# Patient Record
Sex: Female | Born: 1980 | Race: Black or African American | Hispanic: No | Marital: Single | Smoking: Current every day smoker
Health system: Southern US, Community
[De-identification: ages and names within clinical notes are randomized; demographics above are authoritative.]

## PROBLEM LIST (undated history)

## (undated) ENCOUNTER — Emergency Department (HOSPITAL_COMMUNITY): Admission: EM | Disposition: A | Discharge status: Home / Self Care | Payer: Medicaid Other

## (undated) DIAGNOSIS — R569 Unspecified convulsions: Secondary | ICD-10-CM

---

## 2004-08-11 ENCOUNTER — Emergency Department: Payer: Self-pay | Admitting: Unknown Physician Specialty

## 2005-04-23 ENCOUNTER — Emergency Department: Payer: Self-pay | Admitting: Emergency Medicine

## 2005-08-14 ENCOUNTER — Emergency Department: Payer: Self-pay | Admitting: Unknown Physician Specialty

## 2005-08-15 ENCOUNTER — Ambulatory Visit: Payer: Self-pay | Admitting: Unknown Physician Specialty

## 2005-10-24 ENCOUNTER — Emergency Department: Payer: Self-pay | Admitting: Emergency Medicine

## 2006-06-06 ENCOUNTER — Ambulatory Visit: Payer: Self-pay | Admitting: Family Medicine

## 2006-06-22 ENCOUNTER — Emergency Department: Payer: Self-pay | Admitting: Emergency Medicine

## 2007-07-25 ENCOUNTER — Emergency Department: Payer: Self-pay | Admitting: Internal Medicine

## 2007-08-29 ENCOUNTER — Emergency Department: Payer: Self-pay | Admitting: Emergency Medicine

## 2007-09-06 ENCOUNTER — Emergency Department: Payer: Self-pay | Admitting: Emergency Medicine

## 2007-11-01 ENCOUNTER — Emergency Department: Payer: Self-pay | Admitting: Unknown Physician Specialty

## 2008-03-06 ENCOUNTER — Emergency Department: Payer: Self-pay | Admitting: Emergency Medicine

## 2008-03-28 ENCOUNTER — Emergency Department (HOSPITAL_COMMUNITY): Admission: EM | Admit: 2008-03-28 | Discharge: 2008-03-28 | Payer: Self-pay | Admitting: Emergency Medicine

## 2008-05-05 ENCOUNTER — Emergency Department: Payer: Self-pay | Admitting: Emergency Medicine

## 2008-11-24 ENCOUNTER — Ambulatory Visit: Payer: Self-pay | Admitting: Obstetrics and Gynecology

## 2008-11-25 ENCOUNTER — Inpatient Hospital Stay: Payer: Self-pay | Admitting: Obstetrics and Gynecology

## 2008-12-19 ENCOUNTER — Emergency Department: Payer: Self-pay | Admitting: Emergency Medicine

## 2009-01-19 ENCOUNTER — Ambulatory Visit: Payer: Self-pay | Admitting: Pain Medicine

## 2009-02-16 ENCOUNTER — Ambulatory Visit: Payer: Self-pay

## 2009-03-14 ENCOUNTER — Emergency Department: Payer: Self-pay | Admitting: Emergency Medicine

## 2009-03-28 ENCOUNTER — Emergency Department: Payer: Self-pay | Admitting: Emergency Medicine

## 2009-06-22 ENCOUNTER — Emergency Department: Payer: Self-pay | Admitting: Emergency Medicine

## 2009-08-22 ENCOUNTER — Ambulatory Visit: Payer: Self-pay | Admitting: Family Medicine

## 2010-01-04 ENCOUNTER — Ambulatory Visit: Payer: Self-pay | Admitting: Family Medicine

## 2010-02-05 ENCOUNTER — Emergency Department: Payer: Self-pay | Admitting: Emergency Medicine

## 2010-03-21 ENCOUNTER — Emergency Department: Payer: Self-pay | Admitting: Emergency Medicine

## 2010-06-16 ENCOUNTER — Emergency Department: Payer: Self-pay | Admitting: Emergency Medicine

## 2010-09-16 ENCOUNTER — Emergency Department: Payer: Self-pay | Admitting: Emergency Medicine

## 2010-12-09 ENCOUNTER — Emergency Department: Payer: Self-pay | Admitting: Emergency Medicine

## 2011-02-07 ENCOUNTER — Emergency Department: Payer: Self-pay | Admitting: Emergency Medicine

## 2011-07-11 ENCOUNTER — Emergency Department: Payer: Self-pay | Admitting: *Deleted

## 2011-07-25 LAB — URINALYSIS, ROUTINE W REFLEX MICROSCOPIC
Bilirubin Urine: NEGATIVE
Hgb urine dipstick: NEGATIVE
Ketones, ur: NEGATIVE
Nitrite: NEGATIVE
Specific Gravity, Urine: 1.022
Urobilinogen, UA: 0.2
pH: 5.5

## 2011-07-25 LAB — HCG, QUANTITATIVE, PREGNANCY: hCG, Beta Chain, Quant, S: 1215 — ABNORMAL HIGH

## 2012-05-27 IMAGING — CT CT HEAD WITHOUT CONTRAST
2 series · 16 of 30 positions shown, 20 images · non-contrast
Comparison: none

REASON FOR EXAM: SEIZURE, HA
COMMENTS:   May transport without cardiac monitor

PROCEDURE:     CT  - CT HEAD WITHOUT CONTRAST  - December 09, 2010  [DATE]
RESULT:     Comparison:  None
TECHNIQUE: Multiple axial images from the foramen magnum to the vertex were
obtained without IV contrast.

[Series 2: without · axial · non-contrast · 0.39mm/px · z∈[+349,+474]mm · 13 of 31 slices shown, 17 images]
[im 3/31  brain]
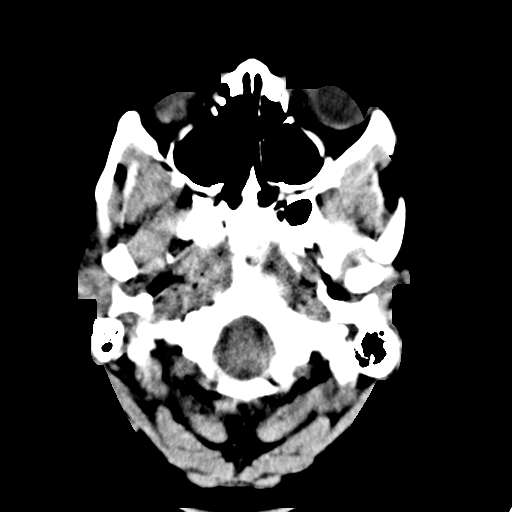
[im 3/31  bone]
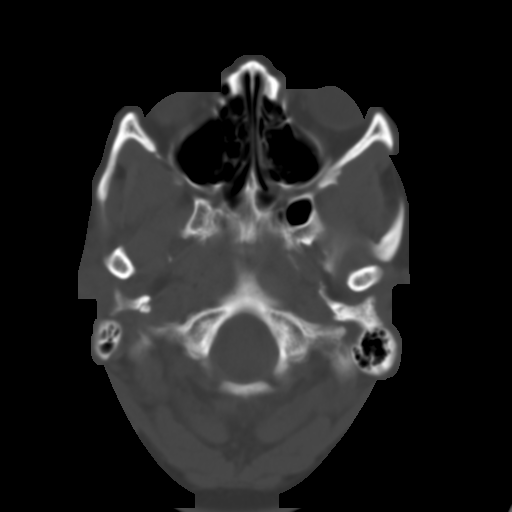
[im 5/31  brain]
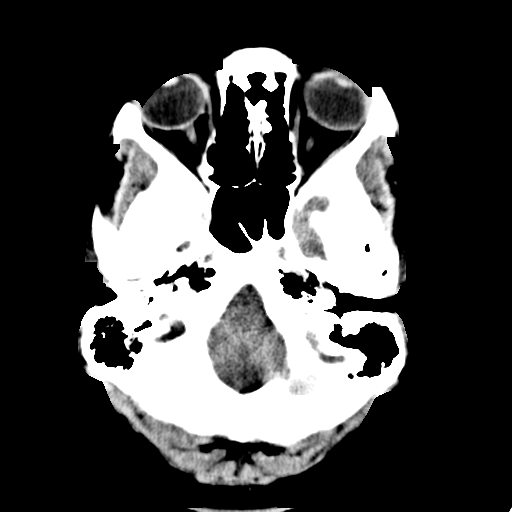
[im 7/31  brain]
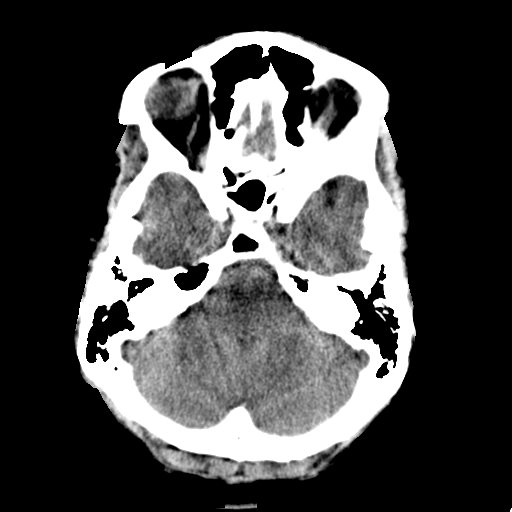
[im 9/31  brain]
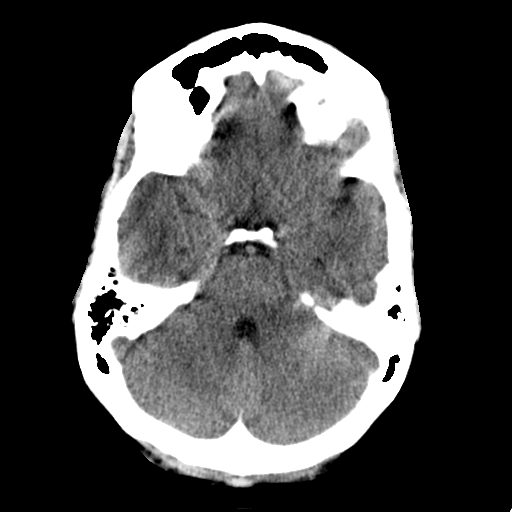
[im 11/31  brain]
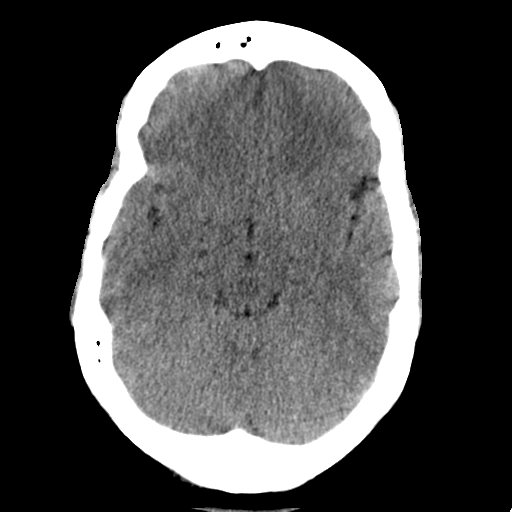
[im 11/31  bone]
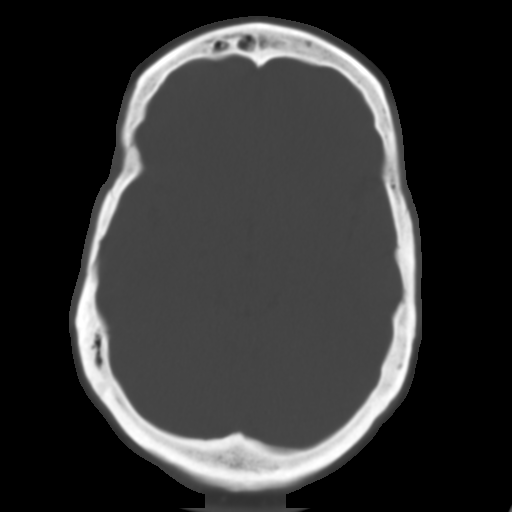
[im 13/31  brain]
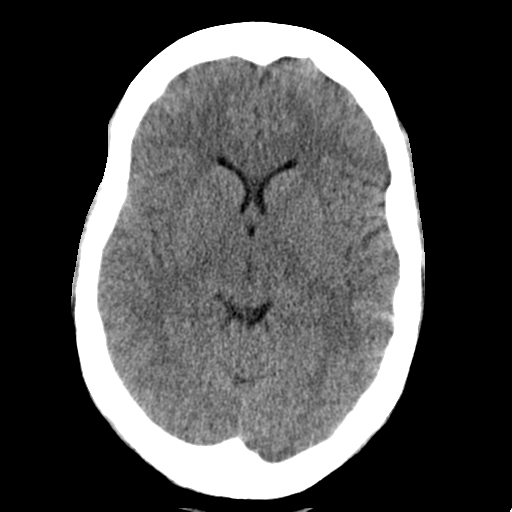
[im 16/31  brain]
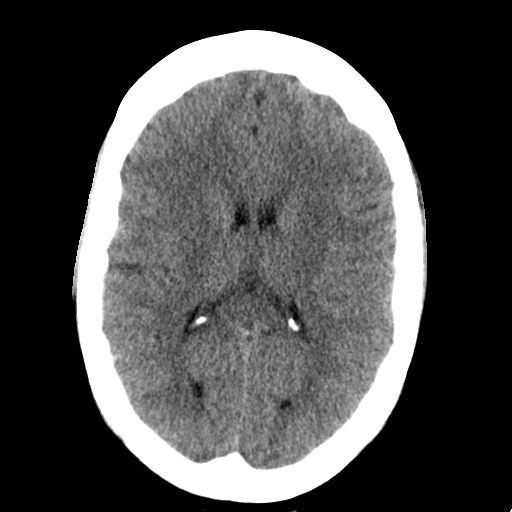
[im 18/31  brain]
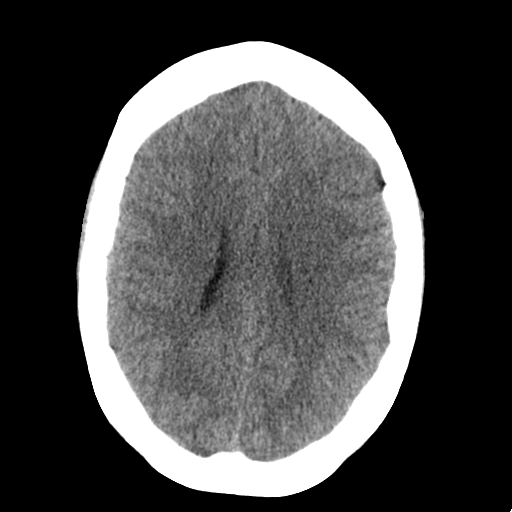
[im 20/31  brain]
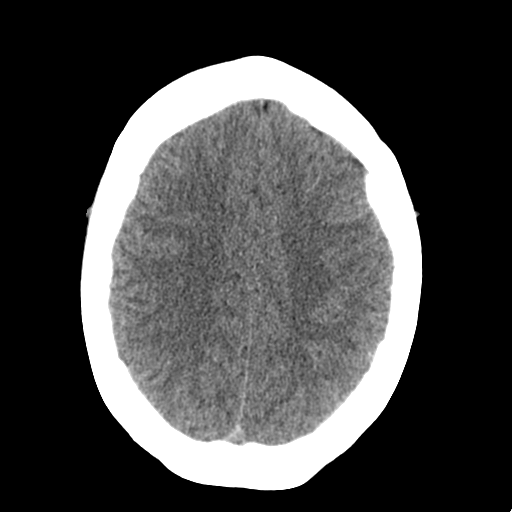
[im 20/31  bone]
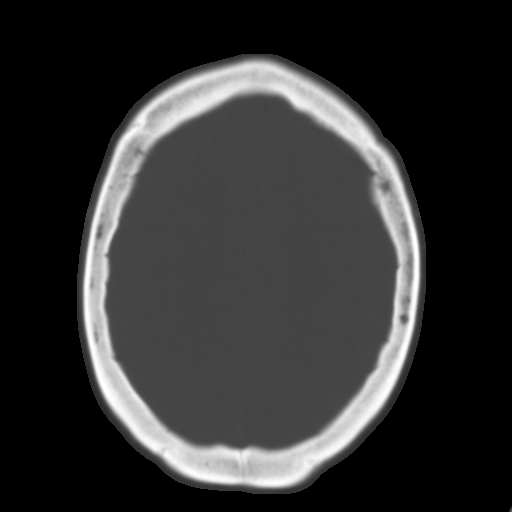
[im 22/31  brain]
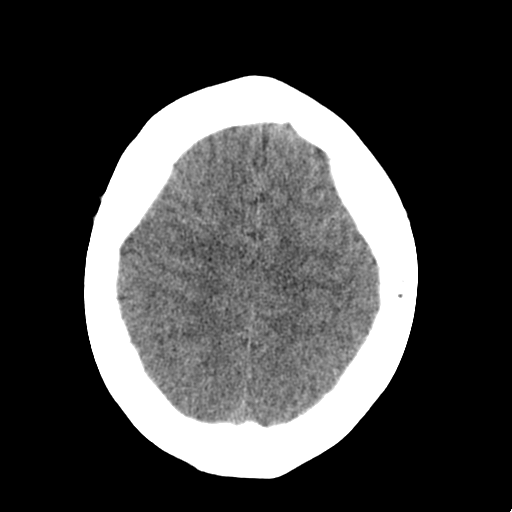
[im 24/31  brain]
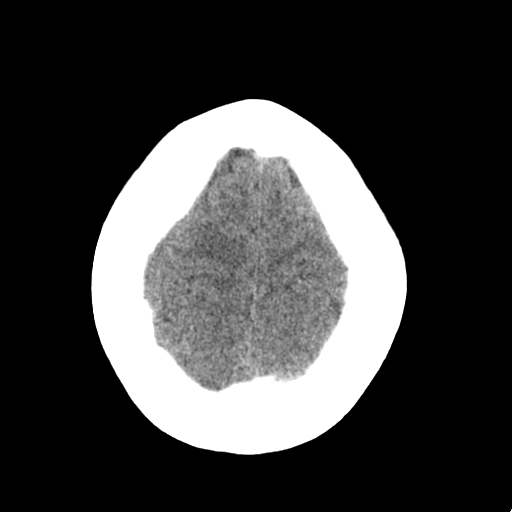
[im 26/31  brain]
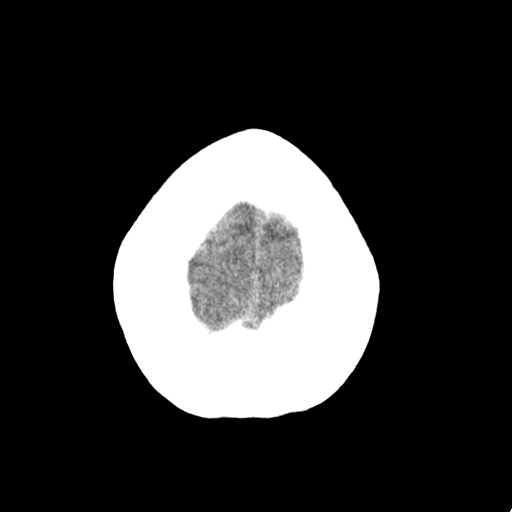
[im 28/31  brain]
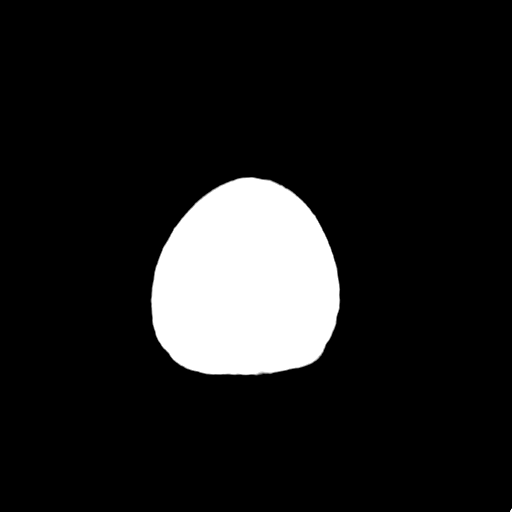
[im 28/31  bone]
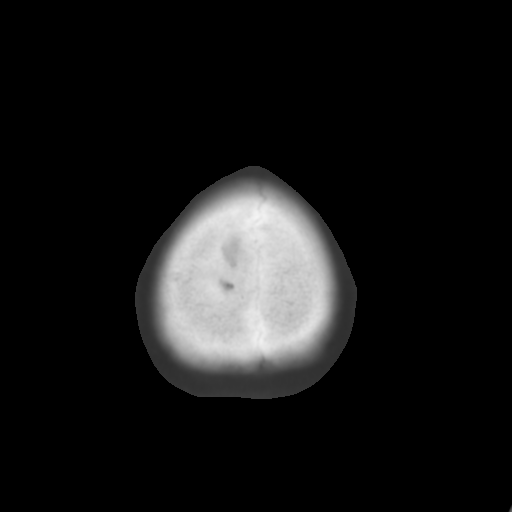

[Series 3: bone · axial · 0.39mm/px · z∈[+349,+389]mm · 3 of 31 slices shown]
[im 3/31  bone]
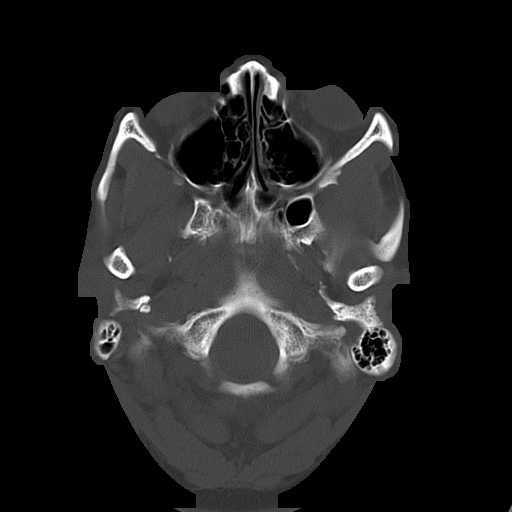
[im 7/31  bone]
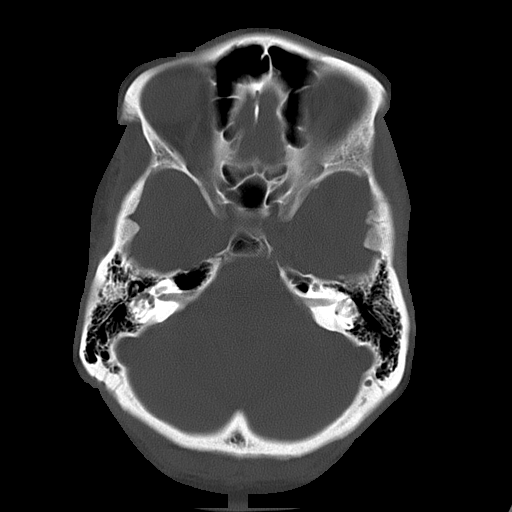
[im 11/31  bone]
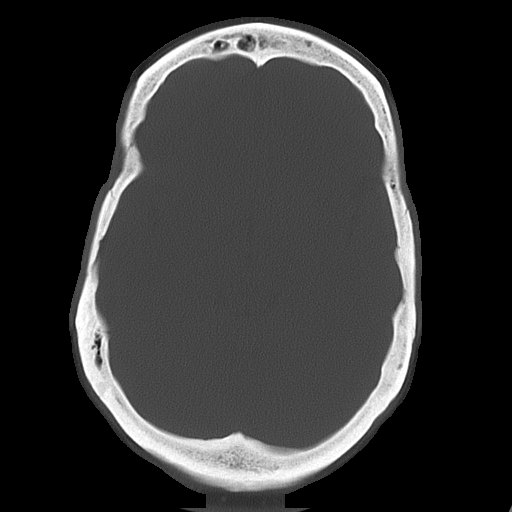

[16 of 30 positions shown; findings below may reference images not displayed]

FINDINGS: There is no evidence for mass effect, midline shift, or extra-axial fluid
collections. There is no evidence for space-occupying lesion, intracranial
hemorrhage, or cortical-based area of infarction.

The osseous structures are unremarkable.
IMPRESSION: No acute intracranial process.

Consider MRI for further workup if this is the first time of seizures.

## 2012-07-02 ENCOUNTER — Emergency Department (HOSPITAL_COMMUNITY)
Admission: EM | Admit: 2012-07-02 | Discharge: 2012-07-02 | Disposition: A | Discharge status: Home / Self Care | Payer: Medicaid Other | Source: Home / Self Care | Attending: Emergency Medicine | Admitting: Emergency Medicine

## 2012-07-02 ENCOUNTER — Encounter (HOSPITAL_COMMUNITY): Payer: Self-pay | Admitting: Emergency Medicine

## 2012-07-02 DIAGNOSIS — IMO0002 Reserved for concepts with insufficient information to code with codable children: Secondary | ICD-10-CM

## 2012-07-02 DIAGNOSIS — M5416 Radiculopathy, lumbar region: Secondary | ICD-10-CM

## 2012-07-02 DIAGNOSIS — S335XXA Sprain of ligaments of lumbar spine, initial encounter: Secondary | ICD-10-CM

## 2012-07-02 DIAGNOSIS — S39012A Strain of muscle, fascia and tendon of lower back, initial encounter: Secondary | ICD-10-CM

## 2012-07-02 MED ORDER — CYCLOBENZAPRINE HCL 10 MG PO TABS: ORAL_TABLET | ORAL | Status: DC

## 2012-07-02 MED ORDER — PREDNISONE 20 MG PO TABS: 40.0000 mg | ORAL_TABLET | Freq: Every day | ORAL | Status: AC

## 2012-07-02 NOTE — ED Notes (Signed)
Pt having back pain that starts in lower back and sometimes shoots down to her her legs, and sometimes up the spine. Pt states it started about a week before her menstural cycle and then it got worse. Pt states she has no recollection of an injury but with 4 boys its a possiblilty. Has tried many OTC meds and no help. Pt states she went to a MD several weeks ago and was mentioned 4 motrin which didi not help.

## 2012-07-02 NOTE — ED Provider Notes (Signed)
.  pc  Jimmie Molly, MD 07/02/12 2118

## 2012-07-02 NOTE — ED Notes (Signed)
Pt assumed when she was told her meds would be e-scribed that she could leave without signing out, or receiving discharge paperwork and AVS.

## 2012-07-02 NOTE — ED Provider Notes (Signed)
History     CSN: [id number]  Arrival date & time 07/02/12  1528   First MD Initiated Contact with Patient 07/02/12 1609      Chief Complaint  Patient presents with  . Back Pain    (Consider location/radiation/quality/duration/timing/severity/associated sxs/prior treatment) HPI Comments: States she is always lifting her 4 boys and believes that is why her low back is hurting.  She has discomfort radiating to B knees.  She reports having and MRI last shich showed"a slipped disc".  She has taken ibuprofen  And a dose of flexeril yesterday with no improvement.    She is getting established with a new MD through her medicaid in the [place] area.  Recently moved from [place]  Patient is a 31 y.o. female presenting with back pain. The history is provided by the patient. No language interpreter was used.  Back Pain  This is a recurrent problem. Episode onset: 1 week ago. The problem occurs constantly. The problem has not changed since onset.The pain is associated with lifting heavy objects. The pain is moderate. The symptoms are aggravated by bending and twisting. The pain is the same all the time. Associated symptoms include numbness, leg pain and paresthesias. Pertinent negatives include no fever, no bowel incontinence, no perianal numbness, no bladder incontinence, no dysuria, no pelvic pain, no paresis, no tingling and no weakness. She has tried NSAIDs and muscle relaxants for the symptoms.    History reviewed. No pertinent past medical history.  History reviewed. No pertinent past surgical history.  History reviewed. No pertinent family history.  History  Substance Use Topics  . Smoking status: Current Everyday Smoker    Types: Cigarettes  . Smokeless tobacco: Not on file  . Alcohol Use: No    OB History    Grav Para Term Preterm Abortions TAB SAB Ect Mult Living                  Review of Systems  Constitutional: Negative for fever and chills.  Gastrointestinal: Negative for  bowel incontinence.  Genitourinary: Negative for bladder incontinence, dysuria and pelvic pain.  Musculoskeletal: Positive for back pain.  Neurological: Positive for numbness and paresthesias. Negative for tingling and weakness.    Allergies  Review of patient's allergies indicates no known allergies.  Home Medications   Current Outpatient Rx  Name Route Sig Dispense Refill  . CYCLOBENZAPRINE HCL 10 MG PO TABS  1/2 to one tab po TID 20 tablet 0  . PREDNISONE 20 MG PO TABS Oral Take 2 tablets (40 mg total) by mouth daily. 10 tablet 0    BP 111/79  Pulse 76  Temp 97.2 F (36.2 C) (Oral)  Resp 16  SpO2 99%  LMP 06/29/2012  Physical Exam  Nursing note and vitals reviewed. Constitutional: She is oriented to person, place, and time. She appears well-developed and well-nourished. No distress.  HENT:  Head: Normocephalic and atraumatic.  Eyes: EOM are normal.  Neck: Normal range of motion.  Cardiovascular: Normal rate, regular rhythm and normal heart sounds.   Pulmonary/Chest: Effort normal and breath sounds normal.  Abdominal: Soft. She exhibits no distension. There is no tenderness.  Musculoskeletal:       Lumbar back: She exhibits decreased range of motion, tenderness and pain. She exhibits no bony tenderness, no spasm and normal pulse.       Back:  Neurological: She is alert and oriented to person, place, and time.  Skin: Skin is warm and dry.  Psychiatric: She has  a normal mood and affect. Judgment normal.    ED Course  Procedures (including critical care time)  Labs Reviewed - No data to display No results found.   1. Lumbar strain   2. Lumbar radiculopathy       MDM  rx-flexeril 10 mg, 20 rx-prednisone 50 mg, 5 Benadryl 5 mg QHS for sleep Referral to [facility] for f/u ice        Evalina Field, PA 07/02/12 1742

## 2013-01-09 ENCOUNTER — Emergency Department (HOSPITAL_COMMUNITY)
Admission: EM | Admit: 2013-01-09 | Discharge: 2013-01-09 | Discharge status: Absent without leave | Payer: Medicaid Other | Source: Home / Self Care

## 2013-04-03 ENCOUNTER — Emergency Department: Payer: Self-pay | Admitting: Emergency Medicine

## 2013-06-27 ENCOUNTER — Emergency Department: Payer: Self-pay | Admitting: Emergency Medicine

## 2014-02-19 ENCOUNTER — Emergency Department: Payer: Self-pay | Admitting: Emergency Medicine

## 2014-04-29 ENCOUNTER — Emergency Department: Payer: Self-pay

## 2014-04-29 LAB — COMPREHENSIVE METABOLIC PANEL
ALK PHOS: 97 U/L
ALT: 17 U/L (ref 12–78)
AST: 25 U/L (ref 15–37)
Albumin: 3.9 g/dL (ref 3.4–5.0)
Anion Gap: 7 (ref 7–16)
BILIRUBIN TOTAL: 0.2 mg/dL (ref 0.2–1.0)
BUN: 9 mg/dL (ref 7–18)
CALCIUM: 8.8 mg/dL (ref 8.5–10.1)
CHLORIDE: 107 mmol/L (ref 98–107)
Co2: 24 mmol/L (ref 21–32)
Creatinine: 0.62 mg/dL (ref 0.60–1.30)
GLUCOSE: 87 mg/dL (ref 65–99)
OSMOLALITY: 274 (ref 275–301)
Potassium: 3.9 mmol/L (ref 3.5–5.1)
Sodium: 138 mmol/L (ref 136–145)
Total Protein: 8.2 g/dL (ref 6.4–8.2)

## 2014-04-29 LAB — CBC WITH DIFFERENTIAL/PLATELET
BASOS ABS: 0.1 10*3/uL (ref 0.0–0.1)
Basophil %: 0.2 %
Eosinophil #: 0 10*3/uL (ref 0.0–0.7)
Eosinophil %: 0.1 %
HCT: 37.2 % (ref 35.0–47.0)
HGB: 11.5 g/dL — ABNORMAL LOW (ref 12.0–16.0)
LYMPHS ABS: 1.8 10*3/uL (ref 1.0–3.6)
LYMPHS PCT: 7.5 %
MCH: 24.2 pg — ABNORMAL LOW (ref 26.0–34.0)
MCHC: 30.8 g/dL — ABNORMAL LOW (ref 32.0–36.0)
MCV: 79 fL — AB (ref 80–100)
MONO ABS: 0.6 x10 3/mm (ref 0.2–0.9)
MONOS PCT: 2.6 %
NEUTROS PCT: 89.6 %
Neutrophil #: 21.8 10*3/uL — ABNORMAL HIGH (ref 1.4–6.5)
PLATELETS: 405 10*3/uL (ref 150–440)
RBC: 4.74 10*6/uL (ref 3.80–5.20)
RDW: 20.1 % — AB (ref 11.5–14.5)
WBC: 24.3 10*3/uL — ABNORMAL HIGH (ref 3.6–11.0)

## 2014-04-29 LAB — URINALYSIS, COMPLETE
BILIRUBIN, UR: NEGATIVE
GLUCOSE, UR: NEGATIVE mg/dL (ref 0–75)
KETONE: NEGATIVE
Leukocyte Esterase: NEGATIVE
NITRITE: NEGATIVE
PH: 5 (ref 4.5–8.0)
PROTEIN: NEGATIVE
RBC,UR: 1 /HPF (ref 0–5)
Specific Gravity: 1.02 (ref 1.003–1.030)
Squamous Epithelial: 2
WBC UR: 3 /HPF (ref 0–5)

## 2014-04-29 LAB — DRUG SCREEN, URINE
Amphetamines, Ur Screen: NEGATIVE (ref ?–1000)
Barbiturates, Ur Screen: NEGATIVE (ref ?–200)
Benzodiazepine, Ur Scrn: NEGATIVE (ref ?–200)
Cannabinoid 50 Ng, Ur ~~LOC~~: POSITIVE (ref ?–50)
Cocaine Metabolite,Ur ~~LOC~~: NEGATIVE (ref ?–300)
MDMA (ECSTASY) UR SCREEN: NEGATIVE (ref ?–500)
Methadone, Ur Screen: NEGATIVE (ref ?–300)
Opiate, Ur Screen: NEGATIVE (ref ?–300)
Phencyclidine (PCP) Ur S: NEGATIVE (ref ?–25)
TRICYCLIC, UR SCREEN: NEGATIVE (ref ?–1000)

## 2014-04-29 LAB — LIPASE, BLOOD: LIPASE: 131 U/L (ref 73–393)

## 2014-12-14 IMAGING — CR DG CHEST 2V
1 series · 2 of 2 positions shown · non-contrast
Comparison: none

REASON FOR EXAM: cough
COMMENTS:

[Series 1: pa · 0.17mm/px · 2 of 2 slices shown]
[im 1/2]
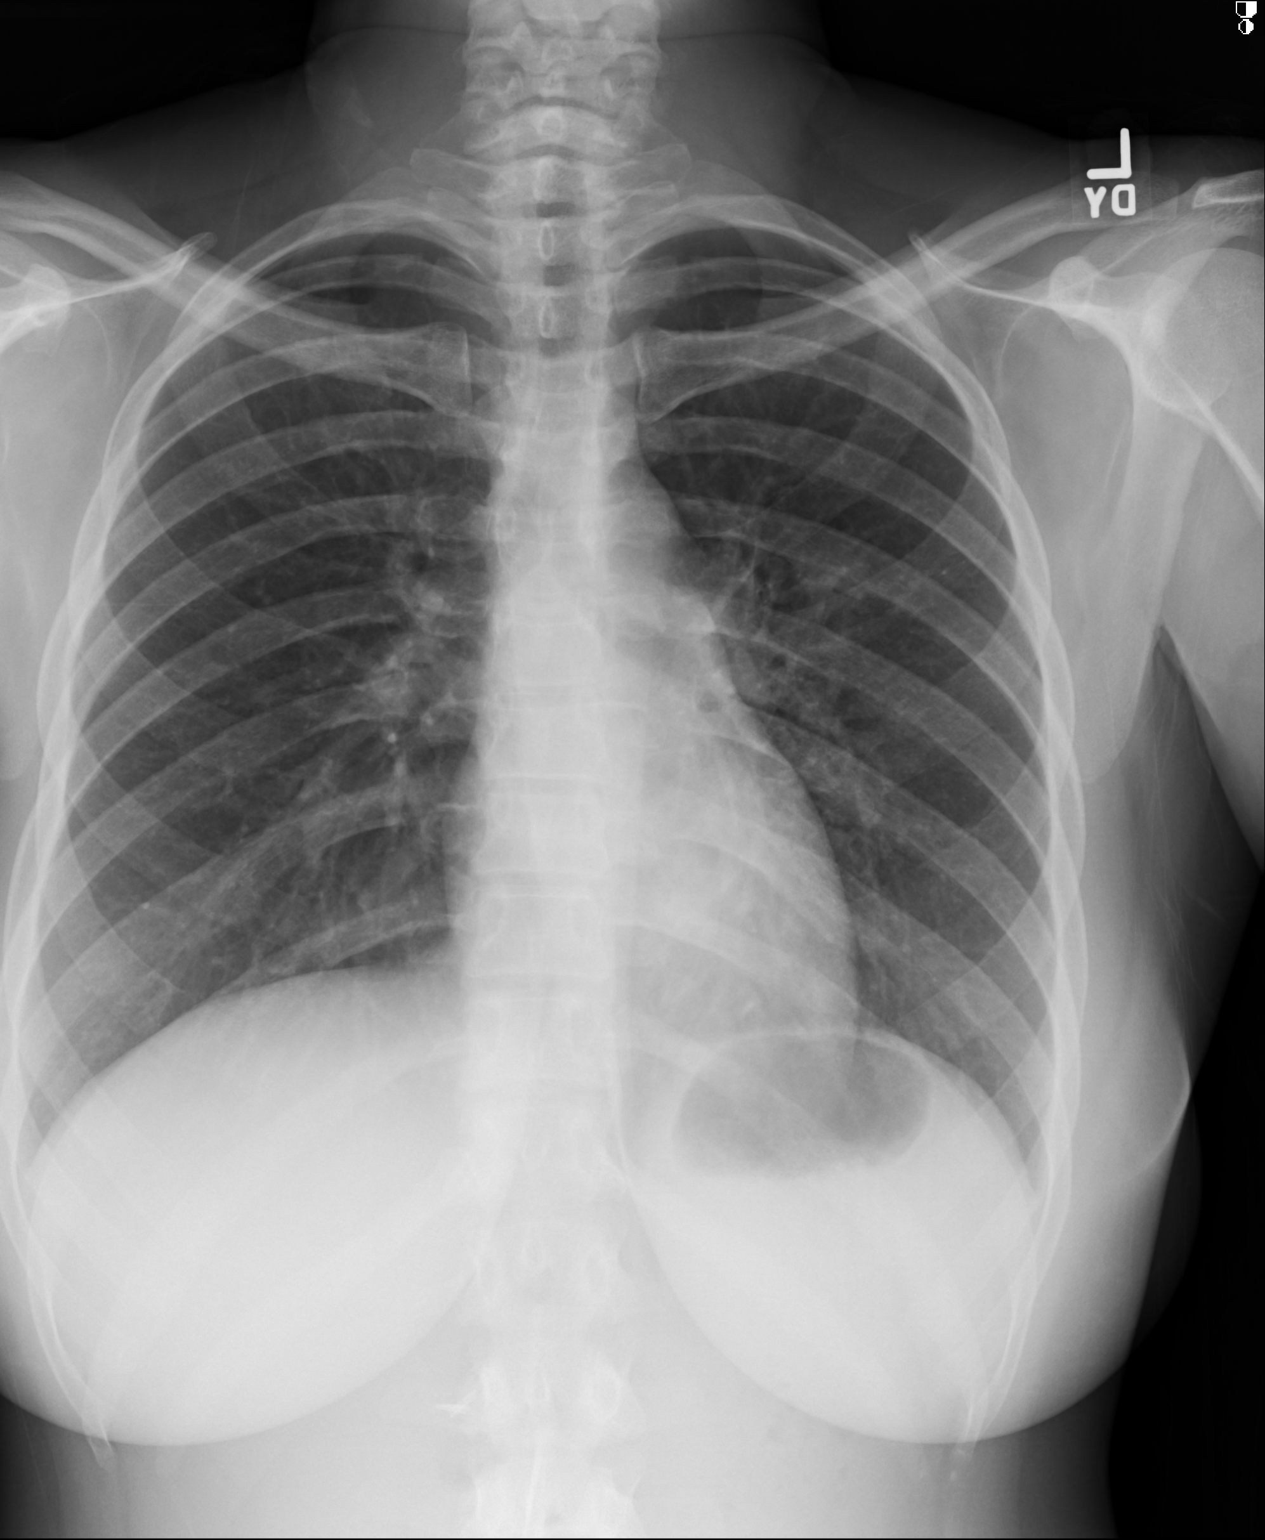
[im 2/2]
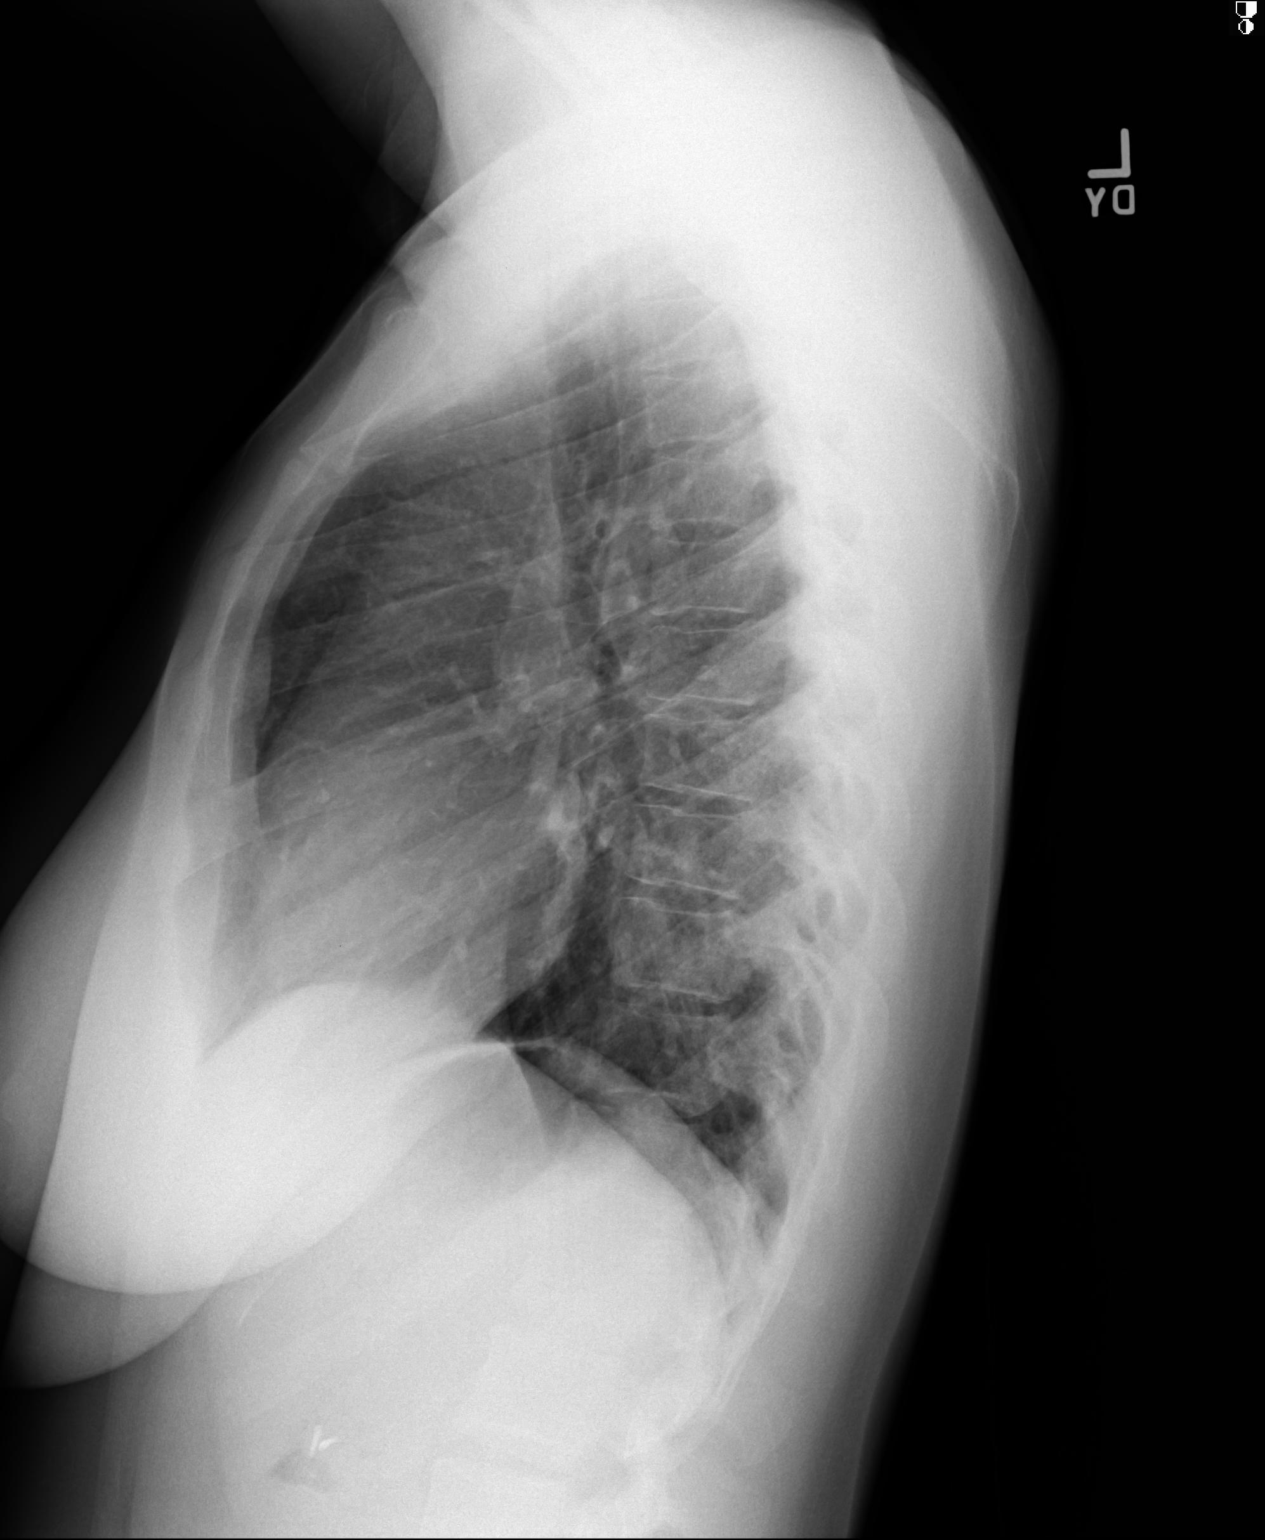

[2 of 2 positions shown; findings below may reference images not displayed]

PROCEDURE:     DXR - DXR CHEST PA (OR AP) AND LATERAL  - June 27, 2013  [DATE]

RESULT:     The lungs are adequately inflated. There is no focal infiltrate.
There are coarse lung markings in the retrocardiac region on the left. There
is no pleural effusion. The mediastinum is normal in width. The cardiac
silhouette is normal in size.
IMPRESSION: I cannot exclude subsegmental atelectasis in the left lower
lobe. There is no focal pneumonia.

[REDACTED]

## 2018-01-05 ENCOUNTER — Other Ambulatory Visit: Payer: Self-pay

## 2018-01-05 DIAGNOSIS — K0889 Other specified disorders of teeth and supporting structures: Secondary | ICD-10-CM | POA: Insufficient documentation

## 2018-01-05 DIAGNOSIS — Z5321 Procedure and treatment not carried out due to patient leaving prior to being seen by health care provider: Secondary | ICD-10-CM | POA: Insufficient documentation

## 2018-01-05 NOTE — ED Triage Notes (Addendum)
Pt's visitor reports toothache pain to bottom right side that started today; pt has used Orajel and excedrin with no relief; pt does say toothache pain is making her right ear hurt as well; pt non participatory in triage, visitor answering all questions for pt; she sat in wheelchair with head down

## 2018-01-06 ENCOUNTER — Emergency Department
Admission: EM | Admit: 2018-01-06 | Discharge: 2018-01-06 | Disposition: A | Discharge status: Home / Self Care | Payer: Self-pay | Attending: Emergency Medicine | Admitting: Emergency Medicine

## 2018-01-06 NOTE — ED Notes (Signed)
Called for treatment room, no answer  

## 2019-07-16 ENCOUNTER — Encounter: Payer: Self-pay | Admitting: Emergency Medicine

## 2019-07-16 ENCOUNTER — Other Ambulatory Visit: Payer: Self-pay

## 2019-07-16 ENCOUNTER — Emergency Department
Admission: EM | Admit: 2019-07-16 | Discharge: 2019-07-16 | Disposition: A | Discharge status: Home / Self Care | Payer: Medicaid Other | Attending: Emergency Medicine | Admitting: Emergency Medicine

## 2019-07-16 DIAGNOSIS — Y9389 Activity, other specified: Secondary | ICD-10-CM | POA: Diagnosis not present

## 2019-07-16 DIAGNOSIS — Y929 Unspecified place or not applicable: Secondary | ICD-10-CM | POA: Diagnosis not present

## 2019-07-16 DIAGNOSIS — W4904XA Ring or other jewelry causing external constriction, initial encounter: Secondary | ICD-10-CM | POA: Diagnosis not present

## 2019-07-16 DIAGNOSIS — S60441A External constriction of left index finger, initial encounter: Secondary | ICD-10-CM | POA: Insufficient documentation

## 2019-07-16 DIAGNOSIS — F17210 Nicotine dependence, cigarettes, uncomplicated: Secondary | ICD-10-CM | POA: Diagnosis not present

## 2019-07-16 DIAGNOSIS — S6992XA Unspecified injury of left wrist, hand and finger(s), initial encounter: Secondary | ICD-10-CM | POA: Diagnosis present

## 2019-07-16 DIAGNOSIS — Y999 Unspecified external cause status: Secondary | ICD-10-CM | POA: Insufficient documentation

## 2019-07-16 MED ORDER — IBUPROFEN 800 MG PO TABS
800.0000 mg | ORAL_TABLET | Freq: Once | ORAL | Status: AC
  Administered 2019-07-16: 800 mg via ORAL
  Filled 2019-07-16: qty 1

## 2019-07-16 NOTE — ED Provider Notes (Signed)
[facility] Emergency Department Provider Note ______   First MD Initiated Contact with Patient 07/16/19 0136     (approximate)  I have reviewed the triage vital signs and the nursing notes.   HISTORY  Chief Complaint Hand Pain   HPI Latoya Molina is a 38 y.o. female resents to the emergency department via EMS with a ring stuck on her left index finger x2 hours.  Patient admits to numbness and swelling to the finger.  Pain score 7 out of 10.        History reviewed. No pertinent past medical history.  There are no active problems to display for this patient.   History reviewed. No pertinent surgical history.  Prior to Admission medications   Not on File    Allergies Morphine and related  No family history on file.  Social History Social History   Tobacco Use  . Smoking status: Current Every Day Smoker    Types: Cigarettes  . Smokeless tobacco: Never Used  Substance Use Topics  . Alcohol use: No  . Drug use: Not on file    Review of Systems Constitutional: No fever/chills Eyes: No visual changes. ENT: No sore throat. Cardiovascular: Denies chest pain. Respiratory: Denies shortness of breath. Gastrointestinal: No abdominal pain.  No nausea, no vomiting.  No diarrhea.  No constipation. Genitourinary: Negative for dysuria. Musculoskeletal: Negative for neck pain.  Negative for back pain. Integumentary: Negative for rash. Neurological: Negative for headaches, focal weakness or numbness.   ____________________________________________   PHYSICAL EXAM:  VITAL SIGNS: ED Triage Vitals  Enc Vitals Group     BP --      Pulse --      Resp --      Temp --      Temp src --      SpO2 --      Weight 07/16/19 0127 95.3 kg (210 lb)     Height 07/16/19 0127 1.626 m (5\' 4" )     Head Circumference --      Peak Flow --      Pain Score 07/16/19 0126 7     Pain Loc --      Pain Edu? --      Excl. in GC? --     Constitutional: Alert and oriented.  Eyes: Conjunctivae are normal.  Mouth/Throat: Mucous membranes are moist. Neck: No stridor.  No meningeal signs.   Musculoskeletal: Metallic ring around the base of the patient's left index finger with swelling distally.  Neurovascular intact cap refill less than 2 seconds Neurologic:  Normal speech and language. No gross focal neurologic deficits are appreciated.  Skin:  Skin is warm, dry and intact. Psychiatric: Mood and affect are normal. Speech and behavior are normal.    Procedures   ____________________________________________   INITIAL IMPRESSION / MDM / ASSESSMENT AND PLAN / ED COURSE  As part of my medical decision making, I reviewed the following data within the electronic MEDICAL RECORD NUMBER 38 year old female presented with above-stated history and physical exam secondary to a ring stuck around her left index finger which was removed with a ring cutter.  Repeat exam revealed neurovascularly intact cap refill less than 2 seconds.  ____________________________________________  FINAL CLINICAL IMPRESSION(S) / ED DIAGNOSES  Final diagnoses:  Ring or other jewelry causing external constriction, initial encounter     MEDICATIONS GIVEN DURING THIS VISIT:  Medications  ibuprofen (ADVIL) tablet 800 mg (has no administration in time range)  ED Discharge Orders    None      *Please note:  Latoya Molina was evaluated in Emergency Department on 07/16/2019 for the symptoms described in the history of present illness. She was evaluated in the context of the global COVID-19 pandemic, which necessitated consideration that the patient might be at risk for infection with the SARS-CoV-2 virus that causes COVID-19. Institutional protocols and algorithms that pertain to the evaluation of patients at risk for COVID-19 are in a state of rapid change based on information released by regulatory bodies including the CDC and federal and state  organizations. These policies and algorithms were followed during the patient's care in the ED.  Some ED evaluations and interventions may be delayed as a result of limited staffing during the pandemic.*  Note:  This document was prepared using Dragon voice recognition software and may include unintentional dictation errors.   Gregor Hams, MD 07/16/19 [phone]

## 2019-07-16 NOTE — ED Notes (Signed)
Black & silver tone band cut & removed ; finger W&D, brisk cap refill, good movement & sensation

## 2019-07-16 NOTE — ED Triage Notes (Signed)
Patient ambulatory to triage with steady gait, without difficulty or distress noted, mask in place; reports ring stuck to left index finger; brought in by EMS

## 2023-04-20 ENCOUNTER — Encounter: Payer: Self-pay | Admitting: Radiology

## 2023-04-20 ENCOUNTER — Other Ambulatory Visit: Payer: Self-pay

## 2023-04-20 ENCOUNTER — Emergency Department: Payer: Medicaid Other

## 2023-04-20 ENCOUNTER — Emergency Department
Admission: EM | Admit: 2023-04-20 | Discharge: 2023-04-20 | Disposition: A | Discharge status: Home / Self Care | Payer: Medicaid Other | Attending: Emergency Medicine | Admitting: Emergency Medicine

## 2023-04-20 DIAGNOSIS — M62838 Other muscle spasm: Secondary | ICD-10-CM | POA: Diagnosis present

## 2023-04-20 DIAGNOSIS — G249 Dystonia, unspecified: Secondary | ICD-10-CM | POA: Insufficient documentation

## 2023-04-20 DIAGNOSIS — D72829 Elevated white blood cell count, unspecified: Secondary | ICD-10-CM | POA: Diagnosis not present

## 2023-04-20 LAB — URINALYSIS, W/ REFLEX TO CULTURE (INFECTION SUSPECTED)
Bilirubin Urine: NEGATIVE
Glucose, UA: NEGATIVE mg/dL
Ketones, ur: NEGATIVE mg/dL
Leukocytes,Ua: NEGATIVE
Nitrite: NEGATIVE
Protein, ur: NEGATIVE mg/dL
Specific Gravity, Urine: 1.023 (ref 1.005–1.030)
pH: 7 (ref 5.0–8.0)

## 2023-04-20 LAB — COMPREHENSIVE METABOLIC PANEL
ALT: 10 U/L (ref 0–44)
AST: 14 U/L — ABNORMAL LOW (ref 15–41)
Albumin: 3.7 g/dL (ref 3.5–5.0)
Alkaline Phosphatase: 97 U/L (ref 38–126)
Anion gap: 7 (ref 5–15)
BUN: 9 mg/dL (ref 6–20)
CO2: 26 mmol/L (ref 22–32)
Calcium: 9.1 mg/dL (ref 8.9–10.3)
Chloride: 102 mmol/L (ref 98–111)
Creatinine, Ser: 0.78 mg/dL (ref 0.44–1.00)
GFR, Estimated: >60 mL/min (ref >60)
Glucose, Bld: 116 mg/dL — ABNORMAL HIGH (ref 70–99)
Potassium: 3.8 mmol/L (ref 3.5–5.1)
Sodium: 135 mmol/L (ref 135–145)
Total Bilirubin: 0.3 mg/dL (ref 0.3–1.2)
Total Protein: 7.9 g/dL (ref 6.5–8.1)

## 2023-04-20 LAB — CBC WITH DIFFERENTIAL/PLATELET
Abs Immature Granulocytes: 0.03 10*3/uL (ref 0.00–0.07)
Basophils Absolute: 0 10*3/uL (ref 0.0–0.1)
Basophils Relative: 0 %
Eosinophils Absolute: 0 10*3/uL (ref 0.0–0.5)
Eosinophils Relative: 0 %
HCT: 38.4 % (ref 36.0–46.0)
Hemoglobin: 12.1 g/dL (ref 12.0–15.0)
Immature Granulocytes: 0 %
Lymphocytes Relative: 18 %
Lymphs Abs: 2.2 10*3/uL (ref 0.7–4.0)
MCH: 24.7 pg — ABNORMAL LOW (ref 26.0–34.0)
MCHC: 31.5 g/dL (ref 30.0–36.0)
MCV: 78.5 fL — ABNORMAL LOW (ref 80.0–100.0)
Monocytes Absolute: 0.6 10*3/uL (ref 0.1–1.0)
Monocytes Relative: 5 %
Neutro Abs: 9.4 10*3/uL — ABNORMAL HIGH (ref 1.7–7.7)
Neutrophils Relative %: 77 %
Platelets: 312 10*3/uL (ref 150–400)
RBC: 4.89 MIL/uL (ref 3.87–5.11)
RDW: 17.2 % — ABNORMAL HIGH (ref 11.5–15.5)
WBC: 12.2 10*3/uL — ABNORMAL HIGH (ref 4.0–10.5)
nRBC: 0 % (ref 0.0–0.2)

## 2023-04-20 LAB — POC URINE PREG, ED: Preg Test, Ur: NEGATIVE

## 2023-04-20 LAB — URINE DRUG SCREEN, QUALITATIVE (ARMC ONLY)
Amphetamines, Ur Screen: NOT DETECTED
Barbiturates, Ur Screen: NOT DETECTED
Benzodiazepine, Ur Scrn: NOT DETECTED
Cannabinoid 50 Ng, Ur ~~LOC~~: NOT DETECTED
Cocaine Metabolite,Ur ~~LOC~~: NOT DETECTED
MDMA (Ecstasy)Ur Screen: NOT DETECTED
Methadone Scn, Ur: NOT DETECTED
Opiate, Ur Screen: NOT DETECTED
Phencyclidine (PCP) Ur S: NOT DETECTED
Tricyclic, Ur Screen: NOT DETECTED

## 2023-04-20 MED ORDER — DIPHENHYDRAMINE HCL 50 MG/ML IJ SOLN
12.5000 mg | Freq: Once | INTRAMUSCULAR | Status: AC
  Administered 2023-04-20: 12.5 mg via INTRAVENOUS
  Filled 2023-04-20: qty 1

## 2023-04-20 NOTE — ED Triage Notes (Signed)
Pt states she began having spasms last night. States she hasn't taken any medications other than her normal meds.PT endorses taking gabapentin as well as suboxone. States she has never had this happen before.

## 2023-04-20 NOTE — Discharge Instructions (Addendum)
Taper your gabapentin: 400mg  3 times per day for 2 days 300mg  3 times per day for 2 days 200mg  3 times per day for 2 days 100mg  3 times per day for 2 days  Take Benadryl 25mg  every 6 hours to help your current symptoms.  Return to the ER for symptoms that change or worsen.

## 2023-04-20 NOTE — ED Provider Notes (Signed)
[facility] Provider Note    Event Date/Time   First MD Initiated Contact with Patient 04/20/23 1041     (approximate)   History   Spasms   HPI  Latoya Molina is a 42 y.o. female with history of substance abuse, neuropathy, ADD and as listed in EMR presents to the emergency department for treatment of facial muscle spasms that started around 2am. She takes Vyvanse, gabapentin, and Suboxone.  She denies taking any other drugs or medications yesterday or last night. No previous similar symptoms.    Physical Exam   Triage Vital Signs: ED Triage Vitals  Enc Vitals Group     BP 04/20/23 1014 (!) 146/86     Pulse Rate 04/20/23 1014 83     Resp 04/20/23 1014 18     Temp 04/20/23 1014 98.4 F (36.9 C)     Temp Source 04/20/23 1014 Oral     SpO2 04/20/23 1014 100 %     Weight 04/20/23 1016 210 lb (95.3 kg)     Height 04/20/23 1016 5\' 4"  (1.626 m)     Head Circumference --      Peak Flow --      Pain Score --      Pain Loc --      Pain Edu? --      Excl. in GC? --     Most recent vital signs: Vitals:   04/20/23 1014  BP: (!) 146/86  Pulse: 83  Resp: 18  Temp: 98.4 F (36.9 C)  SpO2: 100%    General: Awake, no distress.  CV:  Good peripheral perfusion.  Resp:  Normal effort.  Abd:  No distention.  Other:  Intermittent facial spasms   ED Results / Procedures / Treatments   Labs (all labs ordered are listed, but only abnormal results are displayed) Labs Reviewed  URINALYSIS, W/ REFLEX TO CULTURE (INFECTION SUSPECTED) - Abnormal; Notable for the following components:      Result Value   Color, Urine YELLOW (*)    APPearance HAZY (*)    Hgb urine dipstick SMALL (*)    Bacteria, UA RARE (*)    All other components within normal limits  COMPREHENSIVE METABOLIC PANEL - Abnormal; Notable for the following components:   Glucose, Bld 116 (*)    AST 14 (*)    All other components within normal limits  CBC WITH  DIFFERENTIAL/PLATELET - Abnormal; Notable for the following components:   WBC 12.2 (*)    MCV 78.5 (*)    MCH 24.7 (*)    RDW 17.2 (*)    Neutro Abs 9.4 (*)    All other components within normal limits  URINE DRUG SCREEN, QUALITATIVE ([facility] ONLY)  POC URINE PREG, ED     EKG  Not indicated.   RADIOLOGY  Image and radiology report reviewed and interpreted by me. Radiology report consistent with the same.  CT head is negative for acute concerns  PROCEDURES:  Critical Care performed: No  Procedures   MEDICATIONS ORDERED IN ED:  Medications  diphenhydrAMINE (BENADRYL) injection 12.5 mg (12.5 mg Intravenous Given 04/20/23 1057)  diphenhydrAMINE (BENADRYL) injection 12.5 mg (12.5 mg Intravenous Given 04/20/23 1130)     IMPRESSION / MDM / ASSESSMENT AND PLAN / ED COURSE   I have reviewed the triage note.  Differential diagnosis includes, but is not limited to, dystonia, EPS, tremors, substance use  Patient's presentation is most consistent with acute complicated illness / injury requiring  diagnostic workup.  42 year old female presenting to the emergency department for treatment and evaluation of uncontrolled facial spasms that started around 2 AM.  See HPI for further details.  She is not currently taking her Vyvanse.  She had her last dose over a week ago.  The only medications that she has taken are gabapentin and Suboxone.  Initial dose of IV Benadryl 12.5mg  provided some improvement. Second dose ordered.  Again some improvement, but she continues to have twitching of her mouth and now moving her arms in what appears to be involuntary movements.   ED attending, Dr. Sidney Ace, consulted and will evaluate her as well.  Lab studies are essentially normal.  Very mild leukocytosis at 12.2 with no left shift.  Urinalysis is without concern for infection.  Pregnancy test is negative.  Urine drug screen is negative as well.  CT of the head is without acute findings.  No  confirmed cause for patient's current symptoms. There is a possibility that it is related to her gabapentin.  Patient is on 800 mg of gabapentin 3 times a day for neuropathy in her feet.  Plan will be to have her taper off the gabapentin and a taper schedule is provided in her discharge papers.  She is also to take Benadryl every 6 hours to help control her current symptoms.  Strongly encouraged her to call to schedule an appointment with neurology and Dr. Geralyn Flash information is listed on her discharge paperwork.  She is to return to the emergency department if her symptoms change or worsen.      FINAL CLINICAL IMPRESSION(S) / ED DIAGNOSES   Final diagnoses:  Dystonia     Rx / DC Orders   ED Discharge Orders     None        Note:  This document was prepared using Dragon voice recognition software and may include unintentional dictation errors.   Chinita Pester, FNP 04/20/23 1332    Georga Hacking, MD 04/20/23 [phone]

## 2023-04-20 NOTE — ED Notes (Signed)
Patient has ongoing contractions of facial muscles, and involuntary movements of bilateral upper extremities and torso. Legs are still Patient states she is able to drink without issue, but has a poor PO intake today due to the spasms. Patient states she took an Excedrin today for the headache and denies taking any meds not prescribed to her.

## 2023-10-06 ENCOUNTER — Emergency Department: Payer: MEDICAID

## 2023-10-06 ENCOUNTER — Inpatient Hospital Stay
Admission: EM | Admit: 2023-10-06 | Discharge: 2023-10-07 | DRG: 101 | Disposition: A | Discharge status: Home / Self Care | Payer: MEDICAID | Attending: Internal Medicine | Admitting: Internal Medicine

## 2023-10-06 ENCOUNTER — Inpatient Hospital Stay: Payer: MEDICAID

## 2023-10-06 ENCOUNTER — Encounter: Payer: Self-pay | Admitting: Emergency Medicine

## 2023-10-06 DIAGNOSIS — G9340 Encephalopathy, unspecified: Secondary | ICD-10-CM | POA: Diagnosis not present

## 2023-10-06 DIAGNOSIS — R0902 Hypoxemia: Secondary | ICD-10-CM | POA: Diagnosis present

## 2023-10-06 DIAGNOSIS — Z72820 Sleep deprivation: Secondary | ICD-10-CM

## 2023-10-06 DIAGNOSIS — Z885 Allergy status to narcotic agent status: Secondary | ICD-10-CM | POA: Diagnosis not present

## 2023-10-06 DIAGNOSIS — G249 Dystonia, unspecified: Secondary | ICD-10-CM | POA: Diagnosis not present

## 2023-10-06 DIAGNOSIS — Z5329 Procedure and treatment not carried out because of patient's decision for other reasons: Secondary | ICD-10-CM | POA: Diagnosis present

## 2023-10-06 DIAGNOSIS — D72829 Elevated white blood cell count, unspecified: Secondary | ICD-10-CM | POA: Diagnosis present

## 2023-10-06 DIAGNOSIS — E872 Acidosis, unspecified: Secondary | ICD-10-CM | POA: Diagnosis present

## 2023-10-06 DIAGNOSIS — R739 Hyperglycemia, unspecified: Secondary | ICD-10-CM | POA: Diagnosis present

## 2023-10-06 DIAGNOSIS — F17210 Nicotine dependence, cigarettes, uncomplicated: Secondary | ICD-10-CM | POA: Diagnosis present

## 2023-10-06 DIAGNOSIS — F3289 Other specified depressive episodes: Secondary | ICD-10-CM | POA: Diagnosis not present

## 2023-10-06 DIAGNOSIS — G894 Chronic pain syndrome: Secondary | ICD-10-CM | POA: Diagnosis present

## 2023-10-06 DIAGNOSIS — G9341 Metabolic encephalopathy: Secondary | ICD-10-CM | POA: Diagnosis not present

## 2023-10-06 DIAGNOSIS — R569 Unspecified convulsions: Principal | ICD-10-CM

## 2023-10-06 DIAGNOSIS — F32A Depression, unspecified: Secondary | ICD-10-CM | POA: Diagnosis present

## 2023-10-06 HISTORY — DX: Unspecified convulsions: R56.9

## 2023-10-06 LAB — MAGNESIUM: Magnesium: 2.3 mg/dL (ref 1.7–2.4)

## 2023-10-06 LAB — CBC WITH DIFFERENTIAL/PLATELET
Abs Immature Granulocytes: 0.05 10*3/uL (ref 0.00–0.07)
Basophils Absolute: 0 10*3/uL (ref 0.0–0.1)
Basophils Relative: 0 %
Eosinophils Absolute: 0.1 10*3/uL (ref 0.0–0.5)
Eosinophils Relative: 0 %
HCT: 38.1 % (ref 36.0–46.0)
Hemoglobin: 12 g/dL (ref 12.0–15.0)
Immature Granulocytes: 0 %
Lymphocytes Relative: 27 %
Lymphs Abs: 3.8 10*3/uL (ref 0.7–4.0)
MCH: 25.4 pg — ABNORMAL LOW (ref 26.0–34.0)
MCHC: 31.5 g/dL (ref 30.0–36.0)
MCV: 80.5 fL (ref 80.0–100.0)
Monocytes Absolute: 0.8 10*3/uL (ref 0.1–1.0)
Monocytes Relative: 6 %
Neutro Abs: 9.7 10*3/uL — ABNORMAL HIGH (ref 1.7–7.7)
Neutrophils Relative %: 67 %
Platelets: 324 10*3/uL (ref 150–400)
RBC: 4.73 MIL/uL (ref 3.87–5.11)
RDW: 18 % — ABNORMAL HIGH (ref 11.5–15.5)
WBC: 14.5 10*3/uL — ABNORMAL HIGH (ref 4.0–10.5)
nRBC: 0 % (ref 0.0–0.2)

## 2023-10-06 LAB — VITAMIN B12: Vitamin B-12: 362 pg/mL (ref 180–914)

## 2023-10-06 LAB — COMPREHENSIVE METABOLIC PANEL
ALT: 17 U/L (ref 0–44)
AST: 23 U/L (ref 15–41)
Albumin: 3.6 g/dL (ref 3.5–5.0)
Alkaline Phosphatase: 99 U/L (ref 38–126)
Anion gap: 16 — ABNORMAL HIGH (ref 5–15)
BUN: 11 mg/dL (ref 6–20)
CO2: 19 mmol/L — ABNORMAL LOW (ref 22–32)
Calcium: 8.6 mg/dL — ABNORMAL LOW (ref 8.9–10.3)
Chloride: 103 mmol/L (ref 98–111)
Creatinine, Ser: 0.97 mg/dL (ref 0.44–1.00)
GFR, Estimated: >60 mL/min (ref >60)
Glucose, Bld: 166 mg/dL — ABNORMAL HIGH (ref 70–99)
Potassium: 3.5 mmol/L (ref 3.5–5.1)
Sodium: 138 mmol/L (ref 135–145)
Total Bilirubin: 0.3 mg/dL (ref <1.2)
Total Protein: 7.2 g/dL (ref 6.5–8.1)

## 2023-10-06 LAB — URINALYSIS, ROUTINE W REFLEX MICROSCOPIC
Bacteria, UA: NONE SEEN
Bilirubin Urine: NEGATIVE
Glucose, UA: NEGATIVE mg/dL
Ketones, ur: NEGATIVE mg/dL
Leukocytes,Ua: NEGATIVE
Nitrite: NEGATIVE
Protein, ur: 30 mg/dL — AB
Specific Gravity, Urine: 1.025 (ref 1.005–1.030)
pH: 5 (ref 5.0–8.0)

## 2023-10-06 LAB — URINE DRUG SCREEN, QUALITATIVE (ARMC ONLY)
Amphetamines, Ur Screen: NOT DETECTED
Barbiturates, Ur Screen: NOT DETECTED
Benzodiazepine, Ur Scrn: NOT DETECTED
Cannabinoid 50 Ng, Ur ~~LOC~~: NOT DETECTED
Cocaine Metabolite,Ur ~~LOC~~: NOT DETECTED
MDMA (Ecstasy)Ur Screen: NOT DETECTED
Methadone Scn, Ur: POSITIVE — AB
Opiate, Ur Screen: NOT DETECTED
Phencyclidine (PCP) Ur S: NOT DETECTED
Tricyclic, Ur Screen: NOT DETECTED

## 2023-10-06 LAB — CBG MONITORING, ED
Glucose-Capillary: 145 mg/dL — ABNORMAL HIGH (ref 70–99)
Glucose-Capillary: 86 mg/dL (ref 70–99)
Glucose-Capillary: 81 mg/dL (ref 70–99)
Glucose-Capillary: 94 mg/dL (ref 70–99)

## 2023-10-06 LAB — ETHANOL: Alcohol, Ethyl (B): <10 mg/dL (ref <10)

## 2023-10-06 LAB — AMMONIA: Ammonia: 27 umol/L (ref 9–35)

## 2023-10-06 LAB — HCG, QUANTITATIVE, PREGNANCY: hCG, Beta Chain, Quant, S: <1 m[IU]/mL (ref <5)

## 2023-10-06 LAB — HEMOGLOBIN A1C
Hgb A1c MFr Bld: 5.6 % (ref 4.8–5.6)
Mean Plasma Glucose: 114.02 mg/dL

## 2023-10-06 LAB — TSH: TSH: 1.096 u[IU]/mL (ref 0.350–4.500)

## 2023-10-06 MED ORDER — ORAL CARE MOUTH RINSE
15.0000 mL | OROMUCOSAL | Status: DC
  Administered 2023-10-06: 15 mL via OROMUCOSAL
  Filled 2023-10-06 (×15): qty 15

## 2023-10-06 MED ORDER — ONDANSETRON HCL 4 MG/2ML IJ SOLN
4.0000 mg | Freq: Four times a day (QID) | INTRAMUSCULAR | Status: DC | PRN
Start: 2023-10-06 — End: 2023-10-07

## 2023-10-06 MED ORDER — INSULIN ASPART 100 UNIT/ML IJ SOLN: 0.0000 [IU] | INTRAMUSCULAR | Status: DC

## 2023-10-06 MED ORDER — LORAZEPAM 2 MG/ML IJ SOLN: 2.0000 mg | INTRAMUSCULAR | Status: DC | PRN

## 2023-10-06 MED ORDER — LEVETIRACETAM IN NACL 1500 MG/100ML IV SOLN
1500.0000 mg | Freq: Once | INTRAVENOUS | Status: AC
  Administered 2023-10-06: 1500 mg via INTRAVENOUS
  Filled 2023-10-06: qty 100

## 2023-10-06 MED ORDER — LEVETIRACETAM 500 MG/5ML IV SOLN: 2000.0000 mg | Freq: Once | INTRAVENOUS | Status: DC

## 2023-10-06 MED ORDER — LORAZEPAM 2 MG/ML IJ SOLN: 2.0000 mg | Freq: Once | INTRAMUSCULAR | Status: DC

## 2023-10-06 MED ORDER — ORAL CARE MOUTH RINSE: 15.0000 mL | OROMUCOSAL | Status: DC | PRN

## 2023-10-06 MED ORDER — LORAZEPAM 2 MG/ML IJ SOLN
2.0000 mg | Freq: Once | INTRAMUSCULAR | Status: AC
  Administered 2023-10-06: 2 mg via INTRAVENOUS
  Filled 2023-10-06: qty 1

## 2023-10-06 MED ORDER — ENOXAPARIN SODIUM 60 MG/0.6ML IJ SOSY
56.0000 mg | PREFILLED_SYRINGE | INTRAMUSCULAR | Status: DC
  Administered 2023-10-06: 55 mg via SUBCUTANEOUS
  Filled 2023-10-06: qty 0.6

## 2023-10-06 MED ORDER — SODIUM CHLORIDE 0.9 % IV SOLN
75.0000 mL/h | INTRAVENOUS | Status: DC
  Administered 2023-10-06: 75 mL/h via INTRAVENOUS

## 2023-10-06 MED ORDER — LORAZEPAM 2 MG/ML IJ SOLN
INTRAMUSCULAR | Status: AC
  Filled 2023-10-06: qty 1

## 2023-10-06 MED ORDER — DIPHENHYDRAMINE HCL 50 MG/ML IJ SOLN: 25.0000 mg | Freq: Once | INTRAMUSCULAR | Status: DC

## 2023-10-06 MED ORDER — GADOBUTROL 1 MMOL/ML IV SOLN
10.0000 mL | Freq: Once | INTRAVENOUS | Status: AC | PRN
  Administered 2023-10-06: 10 mL via INTRAVENOUS

## 2023-10-06 MED ORDER — ONDANSETRON HCL 4 MG PO TABS
4.0000 mg | ORAL_TABLET | Freq: Four times a day (QID) | ORAL | Status: DC | PRN
Start: 2023-10-06 — End: 2023-10-07

## 2023-10-06 NOTE — Assessment & Plan Note (Signed)
White count 14 on presentation, likely reactive with seizure

## 2023-10-06 NOTE — ED Notes (Signed)
Pt drowsy on arrival to ER but opens eyes intermittently while conversing during interview. Her responses are delayed but she is answering questions appropriately. She has facial twitching and intermittent jerking of bilat upper ext at times. During this initial questioning pt began having tonic clonic movement consistent with grand mal seizure activity that lasted approximately one minute. MD at bedside and pt moved to Rm 16.

## 2023-10-06 NOTE — ED Notes (Signed)
Pt I&O cathed using sterile technique. She is able to lift her hips for chux to be placed under her. No further seizure activity.

## 2023-10-06 NOTE — Assessment & Plan Note (Addendum)
Patient not a diabetic hemoglobin A1c 5.6

## 2023-10-06 NOTE — Assessment & Plan Note (Signed)
Positive generalized lethargy on evaluation status post seizure event Status post IV Keppra and Ativan as well as postictal state CT head within normal limits Urine drug screen positive for methadone No overt infection noted Will add on ammonia and metabolic markers Follow-up neurology recommendations

## 2023-10-06 NOTE — ED Provider Notes (Addendum)
[facility] Provider Note    Event Date/Time   First MD Initiated Contact with Patient 10/06/23 0425     (approximate)   History   Seizures   HPI  Latoya Molina is a 42 y.o. female with history of dystonia, ? seizures per EMS not on antiepileptics, substance abuse on Suboxone who presents to the emergency department with EMS generalized tonic-clonic seizure at home.  Unclear how long this lasted.  EMS reports family told them that she was "foaming at the mouth".  Patient appears to be postictal here.  She has occasional abnormal movements of her cheeks and forehead.  She has history of dystonia that has responded to Benadryl in the past.  She is on gabapentin.  During her last ED visit in June they recommended that she come off of gabapentin as this could contribute to dystonia.   History provided by EMS.    Past Medical History:  Diagnosis Date   Seizures (HCC)     History reviewed. No pertinent surgical history.  MEDICATIONS:  Prior to Admission medications   Not on File    Physical Exam   Triage Vital Signs: ED Triage Vitals  Encounter Vitals Group     BP 10/06/23 0455 134/69     Systolic BP Percentile --      Diastolic BP Percentile --      Pulse Rate 10/06/23 0455 88     Resp 10/06/23 0455 14     Temp 10/06/23 0504 99.2 F (37.3 C)     Temp Source 10/06/23 0504 Oral     SpO2 10/06/23 0455 100 %     Weight 10/06/23 0455 248 lb 0.3 oz (112.5 kg)     Height --      Head Circumference --      Peak Flow --      Pain Score 10/06/23 0424 0     Pain Loc --      Pain Education --      Exclude from Growth Chart --      Most recent vital signs: Vitals:   10/06/23 0515 10/06/23 0518  BP: (!) 126/95   Pulse: (!) 105   Resp: (!) 21   Temp:  99.2 F (37.3 C)  SpO2: 100%     CONSTITUTIONAL: Alert, patient oriented to person and place but not time, slurred speech, drowsy, occasional facial twitching involving both sides of  the face HEAD: Normocephalic, atraumatic EYES: Conjunctivae clear, pupils appear equal, sclera nonicteric ENT: normal nose; moist mucous membranes NECK: Supple, normal ROM CARD: RRR; S1 and S2 appreciated RESP: Normal chest excursion without splinting or tachypnea; breath sounds clear and equal bilaterally; no wheezes, no rhonchi, no rales, no hypoxia or respiratory distress, speaking full sentences ABD/GI: Non-distended; soft, non-tender, no rebound, no guarding, no peritoneal signs BACK: The back appears normal EXT: Normal ROM in all joints; no deformity noted, no edema SKIN: Normal color for age and race; warm; no rash on exposed skin NEURO: Moves all extremities equally, slightly slurred speech, no facial asymmetry, patient appears drowsy and postictal    ED Results / Procedures / Treatments   LABS: (all labs ordered are listed, but only abnormal results are displayed) Labs Reviewed  CBC WITH DIFFERENTIAL/PLATELET - Abnormal; Notable for the following components:      Result Value   WBC 14.5 (*)    MCH 25.4 (*)    RDW 18.0 (*)    Neutro Abs 9.7 (*)  All other components within normal limits  COMPREHENSIVE METABOLIC PANEL - Abnormal; Notable for the following components:   CO2 19 (*)    Glucose, Bld 166 (*)    Calcium 8.6 (*)    Anion gap 16 (*)    All other components within normal limits  URINALYSIS, ROUTINE W REFLEX MICROSCOPIC - Abnormal; Notable for the following components:   Color, Urine YELLOW (*)    APPearance CLEAR (*)    Hgb urine dipstick SMALL (*)    Protein, ur 30 (*)    All other components within normal limits  CBG MONITORING, ED - Abnormal; Notable for the following components:   Glucose-Capillary 145 (*)    All other components within normal limits  ETHANOL  MAGNESIUM  HCG, QUANTITATIVE, PREGNANCY  URINE DRUG SCREEN, QUALITATIVE [facility] ONLY)     EKG:  EKG Interpretation Date/Time:  Sunday October 06 2023 04:51:30 EST Ventricular Rate:  99 PR  Interval:  140 QRS Duration:  102 QT Interval:  351 QTC Calculation: 451 R Axis:   16  Text Interpretation: Sinus rhythm Abnormal R-wave progression, early transition Confirmed by Rochele Raring [id number]) on 10/06/2023 4:52:41 AM         RADIOLOGY: My personal review and interpretation of imaging: CT head and cervical spine show no acute traumatic injury.  I have personally reviewed all radiology reports.   CT Cervical Spine Wo Contrast  Result Date: 10/06/2023 CLINICAL DATA:  42 year old female with altered mental status. Seizure like activity. EXAM: CT CERVICAL SPINE WITHOUT CONTRAST TECHNIQUE: Multidetector CT imaging of the cervical spine was performed without intravenous contrast. Multiplanar CT image reconstructions were also generated. RADIATION DOSE REDUCTION: This exam was performed according to the departmental dose-optimization program which includes automated exposure control, adjustment of the mA and/or kV according to patient size and/or use of iterative reconstruction technique. COMPARISON:  Head CT today.  Previous cervical spine CT 08/15/2005. FINDINGS: Alignment: Stable chronic straightening and mild reversal of cervical lordosis. Cervicothoracic junction alignment is within normal limits. Bilateral posterior element alignment is within normal limits. Skull base and vertebrae: Bone mineralization is within normal limits. Visualized skull base is intact. No atlanto-occipital dissociation. C1 and C2 appear intact and aligned. No acute osseous abnormality identified. Soft tissues and spinal canal: No prevertebral fluid or swelling. No visible canal hematoma. Mild retained secretions only in the nasopharynx. Hypopharynx is distended with gas. Negative visible other noncontrast neck soft tissues. Disc levels: Mild chronic disc and endplate degeneration at C6-C7 has progressed since 2006. Upper chest: Negative; mild respiratory motion. Other: Absent dentition. IMPRESSION: 1. No acute  traumatic injury identified in the cervical spine. 2. Mild chronic disc and endplate degeneration at C6-C7. Electronically Signed   By: Odessa Fleming M.D.   On: 10/06/2023 05:57   CT HEAD WO CONTRAST ( )  Result Date: 10/06/2023 CLINICAL DATA:  42 year old female with altered mental status. Seizure like activity. EXAM: CT HEAD WITHOUT CONTRAST TECHNIQUE: Contiguous axial images were obtained from the base of the skull through the vertex without intravenous contrast. RADIATION DOSE REDUCTION: This exam was performed according to the departmental dose-optimization program which includes automated exposure control, adjustment of the mA and/or kV according to patient size and/or use of iterative reconstruction technique. COMPARISON:  Head CT 04/20/2023. FINDINGS: Brain: Cerebral volume appears stable and normal. No midline shift, ventriculomegaly, mass effect, evidence of mass lesion, intracranial hemorrhage or evidence of cortically based acute infarction. Gray-white matter differentiation is within normal limits throughout the brain. No edema or  encephalomalacia identified. Vascular: No suspicious intracranial vascular hyperdensity. Skull: Hyperostosis of the calvarium again noted. No acute osseous abnormality identified. Sinuses/Orbits: Visualized paranasal sinuses and mastoids are stable and well aerated. Other: Small volume retained secretions in the visible pharynx. No orbit or scalp soft tissue injury identified. IMPRESSION: 1. Stable and normal noncontrast CT appearance of the brain. 2. Mild retained secretions in the trachea. No acute traumatic injury identified. Electronically Signed   By: Odessa Fleming M.D.   On: 10/06/2023 05:54     PROCEDURES:  Critical Care performed: Yes, see critical care procedure note(s)   CRITICAL CARE Performed by: Baxter Hire Ariadne Rissmiller   Total critical care time: 40 minutes  Critical care time was exclusive of separately billable procedures and treating other patients.  Critical  care was necessary to treat or prevent imminent or life-threatening deterioration.  Critical care was time spent personally by me on the following activities: development of treatment plan with patient and/or surrogate as well as nursing, discussions with consultants, evaluation of patient's response to treatment, examination of patient, obtaining history from patient or surrogate, ordering and performing treatments and interventions, ordering and review of laboratory studies, ordering and review of radiographic studies, pulse oximetry and re-evaluation of patient's condition.   Marland Kitchen1-3 Lead EKG Interpretation  Performed by: Drevon Plog, Layla Maw, DO Authorized by: Shanyah Gattuso, Layla Maw, DO     Interpretation: abnormal     ECG rate:  110   ECG rate assessment: tachycardic     Rhythm: sinus tachycardia     Ectopy: none     Conduction: normal       IMPRESSION / MDM / ASSESSMENT AND PLAN / ED COURSE  I reviewed the triage vital signs and the nursing notes.    Patient here with seizure activity at home.  Currently appears postictal.  History of dystonia and substance abuse on Suboxone.  The patient is on the cardiac monitor to evaluate for evidence of arrhythmia and/or significant heart rate changes.   DIFFERENTIAL DIAGNOSIS (includes but not limited to):   Seizures, dystonia, tics, PNES electrolyte derangement, anemia, UTI, intoxication, intracranial hemorrhage, mass   Patient's presentation is most consistent with acute presentation with potential threat to life or bodily function.   PLAN: Will obtain labs, urine, CT head.  While I was placing orders for patient she had a generalized tonic-clonic seizure lasting less than 60 seconds.  She became tachycardic and hypoxic but quickly vitals improved.  No incontinence.  Protecting airway.  Postictal currently.  Will load with 3 g of Keppra given I am concerned for possible status epilepticus given she did not return although back to baseline between  the seizures.  Will give IV Ativan.  Blood glucose 145.  Patient will need admission.  I have attempted to get in touch with her significant other who is listed as her emergency contact and have left a HIPAA compliant voicemail.   MEDICATIONS GIVEN IN ED: Medications  LORazepam (ATIVAN) 2 MG/ML injection (has no administration in time range)  LORazepam (ATIVAN) injection 2 mg (2 mg Intravenous Given 10/06/23 0450)  levETIRAcetam (KEPPRA) IVPB 1500 mg/ 100 mL premix (0 mg Intravenous Stopped 10/06/23 0517)    Followed by  levETIRAcetam (KEPPRA) IVPB 1500 mg/ 100 mL premix (1,500 mg Intravenous New Bag/Given 10/06/23 0520)     ED COURSE: Patient's labs show leukocytosis likely reactive.  Mild metabolic acidosis with glucose of 166.  Likely secondary to elevated lactic from seizure activity.  Ethanol level negative.  Urine shows no  infection.  CT head and neck reviewed and interpreted by myself and the radiologist and are unremarkable.  Vital signs improving.  Patient protecting her airway.  She will arouse to voice and painful stimuli and is able to tell me her name and tell me to leave her alone.  She moves all extremities equally and purposefully.  Her speech is clear.  No further seizure-like activity.  I do not think currently she is in status.  She is drowsy but I suspect this is secondary to postictal state and Ativan.  Will discuss with hospitalist for admission.   CONSULTS:  Consulted and discussed patient's case with hospitalist, Dr. Para March.  I have recommended admission and consulting physician agrees and will place admission orders.  Patient (and family if present) agree with this plan.   I reviewed all nursing notes, vitals, pertinent previous records.  All labs, EKGs, imaging ordered have been independently reviewed and interpreted by myself.    OUTSIDE RECORDS REVIEWED: Reviewed last family medicine note.  Appears patient was taken off gabapentin on 09/17/2023 and started on Lyrica  for neuropathy.  However it appears based on her controlled substance reports that she filled a prescription of 120 tablets of gabapentin on 10/03/2023.       FINAL CLINICAL IMPRESSION(S) / ED DIAGNOSES   Final diagnoses:  Seizure (HCC)     Rx / DC Orders   ED Discharge Orders     None        Note:  This document was prepared using Dragon voice recognition software and may include unintentional dictation errors.       Ronda Kazmi, Layla Maw, DO 10/06/23 [phone]

## 2023-10-06 NOTE — H&P (Addendum)
History and Physical    Patient: Latoya Molina MWU:[id number] DOB: 04-Feb-1981 DOA: 10/06/2023 DOS: the patient was seen and examined on 10/06/2023 PCP: System, Provider Not In  Patient coming from: Home  Chief Complaint:  Chief Complaint  Patient presents with   Seizures   HPI: Latoya Molina is a 42 y.o. female with medical history significant of chronic pain, dystonia presenting with seizure and encephalopathy.  Limited history in setting of encephalopathy.  Per report, EMS was called because of seizure-like activity at home.?  Report of history of seizure roughly 1 year ago but not on seizure medication.  Patient noted had a generalized tonic-clonic seizure consistent with grand mal event during initial evaluation in the ER.  Lasting roughly 1 minute.  No reported fevers or chills, nausea or vomiting.  No reported drug use. Presented to the ER Tmax 99.2, heart rate 90s to 100s, BP stable.  Satting well on room air.  White count 14, hemoglobin 12, platelets 324, urinalysis not indicative of infection, urine drug screen positive for methadone, creatinine 0.97, glucose in the 160s.  Alcohol level within normal limits.  CT head and CT C-spine grossly stable.  U pregnant negative.  Review of Systems: unable to review all systems due to the inability of the patient to answer questions. Past Medical History:  Diagnosis Date   Seizures (HCC)    History reviewed. No pertinent surgical history. Social History:  reports that she has been smoking cigarettes. She has never used smokeless tobacco. She reports that she does not currently use drugs. She reports that she does not drink alcohol.  Allergies  Allergen Reactions   Morphine And Codeine     History reviewed. No pertinent family history.  Prior to Admission medications   Not on File    Physical Exam: Vitals:   10/06/23 0530 10/06/23 0600 10/06/23 0615 10/06/23 0645  BP: 117/69 107/64 105/65 105/63  Pulse: 86 87 81 81   Resp: 11 17 16 15   Temp:      TempSrc:      SpO2: 100% 100% 100% 99%  Weight:       Physical Exam Constitutional:      Appearance: She is normal weight.     Comments: Lethargic at the bedside   HENT:     Head: Normocephalic and atraumatic.     Mouth/Throat:     Mouth: Mucous membranes are dry.  Eyes:     Conjunctiva/sclera: Conjunctivae normal.     Pupils: Pupils are equal, round, and reactive to light.  Cardiovascular:     Rate and Rhythm: Normal rate and regular rhythm.  Pulmonary:     Effort: Pulmonary effort is normal.  Abdominal:     General: Bowel sounds are normal.  Musculoskeletal:     Comments: + generalized weakness    Skin:    General: Skin is warm.  Neurological:     Comments: Pt lethargic/sleeping    Psychiatric:        Mood and Affect: Mood normal.     Data Reviewed:  There are no new results to review at this time.   CT Cervical Spine Wo Contrast CLINICAL DATA:  42 year old female with altered mental status. Seizure like activity.  EXAM: CT CERVICAL SPINE WITHOUT CONTRAST  TECHNIQUE: Multidetector CT imaging of the cervical spine was performed without intravenous contrast. Multiplanar CT image reconstructions were also generated.  RADIATION DOSE REDUCTION: This exam was performed according to the departmental dose-optimization program which includes automated exposure  control, adjustment of the mA and/or kV according to patient size and/or use of iterative reconstruction technique.  COMPARISON:  Head CT today.  Previous cervical spine CT 08/15/2005.  FINDINGS: Alignment: Stable chronic straightening and mild reversal of cervical lordosis. Cervicothoracic junction alignment is within normal limits. Bilateral posterior element alignment is within normal limits.  Skull base and vertebrae: Bone mineralization is within normal limits. Visualized skull base is intact. No atlanto-occipital dissociation. C1 and C2 appear intact and aligned.  No acute osseous abnormality identified.  Soft tissues and spinal canal: No prevertebral fluid or swelling. No visible canal hematoma. Mild retained secretions only in the nasopharynx. Hypopharynx is distended with gas. Negative visible other noncontrast neck soft tissues.  Disc levels: Mild chronic disc and endplate degeneration at C6-C7 has progressed since 2006.  Upper chest: Negative; mild respiratory motion.  Other: Absent dentition.  IMPRESSION: 1. No acute traumatic injury identified in the cervical spine. 2. Mild chronic disc and endplate degeneration at C6-C7.  Electronically Signed   By: Odessa Fleming M.D.   On: 10/06/2023 05:57 CT HEAD WO CONTRAST ( ) CLINICAL DATA:  42 year old female with altered mental status. Seizure like activity.  EXAM: CT HEAD WITHOUT CONTRAST  TECHNIQUE: Contiguous axial images were obtained from the base of the skull through the vertex without intravenous contrast.  RADIATION DOSE REDUCTION: This exam was performed according to the departmental dose-optimization program which includes automated exposure control, adjustment of the mA and/or kV according to patient size and/or use of iterative reconstruction technique.  COMPARISON:  Head CT 04/20/2023.  FINDINGS: Brain: Cerebral volume appears stable and normal. No midline shift, ventriculomegaly, mass effect, evidence of mass lesion, intracranial hemorrhage or evidence of cortically based acute infarction. Gray-white matter differentiation is within normal limits throughout the brain. No edema or encephalomalacia identified.  Vascular: No suspicious intracranial vascular hyperdensity.  Skull: Hyperostosis of the calvarium again noted. No acute osseous abnormality identified.  Sinuses/Orbits: Visualized paranasal sinuses and mastoids are stable and well aerated.  Other: Small volume retained secretions in the visible pharynx. No orbit or scalp soft tissue injury  identified.  IMPRESSION: 1. Stable and normal noncontrast CT appearance of the brain. 2. Mild retained secretions in the trachea. No acute traumatic injury identified.  Electronically Signed   By: Odessa Fleming M.D.   On: 10/06/2023 05:54  Lab Results  Component Value Date   WBC 14.5 (H) 10/06/2023   HGB 12.0 10/06/2023   HCT 38.1 10/06/2023   MCV 80.5 10/06/2023   PLT 324 10/06/2023   Last metabolic panel Lab Results  Component Value Date   GLUCOSE 166 (H) 10/06/2023   NA 138 10/06/2023   K 3.5 10/06/2023   CL 103 10/06/2023   CO2 19 (L) 10/06/2023   BUN 11 10/06/2023   CREATININE 0.97 10/06/2023   GFRNONAA >60 10/06/2023   CALCIUM 8.6 (L) 10/06/2023   PROT 7.2 10/06/2023   ALBUMIN 3.6 10/06/2023   BILITOT 0.3 10/06/2023   ALKPHOS 99 10/06/2023   AST 23 10/06/2023   ALT 17 10/06/2023   ANIONGAP 16 (H) 10/06/2023    Assessment and Plan: * Seizure (HCC) Noted generalized weakness generalized tonic-clonic seizure while in ER Status post IV Keppra load and Ativan Unclear if this is a new diagnosis CT head and CT C-spine grossly within normal limits Will continue with as needed Ativan for now [facility] consult neurology for further evaluation   Hyperglycemia Blood sugar in 160s SSI A1c  Leukocytosis White count 14 on presentation Suspect  secondary to seizure event No overt source of infection noted Otherwise trend for now Follow closely  Encephalopathy Positive generalized lethargy on evaluation status post seizure event Status post IV Keppra and Ativan as well as postictal state CT head within normal limits Urine drug screen positive for methadone No overt infection noted Will add on ammonia and metabolic markers Follow-up neurology recommendations    Greater than 50% was spent in counseling and coordination of care with patient Total encounter time 80 minutes or more   Advance Care Planning:   Code Status: Full Code   Consults: Neurology   Family  Communication: No family at the bedside   Severity of Illness: The appropriate patient status for this patient is INPATIENT. Inpatient status is judged to be reasonable and necessary in order to provide the required intensity of service to ensure the patient's safety. The patient's presenting symptoms, physical exam findings, and initial radiographic and laboratory data in the context of their chronic comorbidities is felt to place them at high risk for further clinical deterioration. Furthermore, it is not anticipated that the patient will be medically stable for discharge from the hospital within 2 midnights of admission.   * I certify that at the point of admission it is my clinical judgment that the patient will require inpatient hospital care spanning beyond 2 midnights from the point of admission due to high intensity of service, high risk for further deterioration and high frequency of surveillance required.*  Author: Floydene Flock, MD 10/06/2023 9:14 AM  For on call review [email or web address].

## 2023-10-06 NOTE — Assessment & Plan Note (Addendum)
Noted generalized weakness generalized tonic-clonic seizure while in ER Status post IV Keppra load and Ativan Unclear if this is a new diagnosis CT head and CT C-spine grossly within normal limits Will continue with as needed Ativan for now [facility] consult neurology for further evaluation

## 2023-10-06 NOTE — ED Triage Notes (Addendum)
Pt presents from home via EMS for seizure-like activity. H/o seizure 1 yr ago, not on seizure prevention medication. Takes suboxone and gabapentin.  Arrives A&Ox4.

## 2023-10-06 NOTE — ED Notes (Signed)
Pt to CT via stretcher on monitor with this Clinical research associate. No further seizure activity. Pt remains postictal but will open eyes on command. Grips hands on command and grips remains weak but equal. Nonverbal at this time.

## 2023-10-06 NOTE — ED Notes (Signed)
Lab called at this time for blood work

## 2023-10-06 NOTE — Consult Note (Signed)
NEUROLOGY CONSULT NOTE   Date of service: October 06, 2023 Patient Name: Latoya Molina MRN:  [id number] DOB:  08-21-1981 Chief Complaint: seizures Requesting Provider: Floydene Flock, MD  History of Present Illness  Latoya Molina is a 42 y.o. female  has a past medical history of Seizures (HCC).   This is a 42 year old woman with a past medical history significant for chronic pain, cervical dystonia, who presented with seizure and encephalopathy.  She does not remember the events but her son's father at bedside reports that she had a generalized seizure with unresponsiveness and rhythmic convulsions of her extremities after which she was tired and confused.  She had a second generalized tonic-clonic seizure that was witnessed during her evaluation in the ER which lasted approximately 1 minute.  On my exam she reports the year was 2025 and had a mild dysarthria and otherwise no focal neurologic deficits.  She does report that she has been extremely sleep deprived in the context of her work.  Since father reports that her most recent seizure was approximately 15 years ago.  She does not remember these events however he reports that she would get a glassy look on her face and he would know that she was about to have a seizure and then she would become unresponsive shaking all over on the floor.  She never sought medical attention for these episodes and reports that she has not had one in the past 15 yrs.   ROS  Comprehensive ROS performed and pertinent positives documented in HPI   Past History   Past Medical History:  Diagnosis Date   Seizures (HCC)     History reviewed. No pertinent surgical history.  Family History: History reviewed. No pertinent family history.  Social History  reports that she has been smoking cigarettes. She has never used smokeless tobacco. She reports that she does not currently use drugs. She reports that she does not drink  alcohol.  Allergies  Allergen Reactions   Morphine And Codeine     Medications   Current Facility-Administered Medications:    0.9 %  sodium chloride infusion, 75 mL/hr, Intravenous, Continuous, Floydene Flock, MD, Last Rate: 75 mL/hr at 10/06/23 1301, 75 mL/hr at 10/06/23 1301   enoxaparin (LOVENOX) injection 55 mg, 55 mg, Subcutaneous, Q24H, Floydene Flock, MD, 55 mg at 10/06/23 1312   insulin aspart (novoLOG) injection 0-9 Units, 0-9 Units, Subcutaneous, Q4H, Floydene Flock, MD   LORazepam (ATIVAN) 2 MG/ML injection, , , ,    LORazepam (ATIVAN) injection 2 mg, 2 mg, Intravenous, PRN, Andris Baumann, MD   ondansetron (ZOFRAN) tablet 4 mg, 4 mg, Oral, Q6H PRN **OR** ondansetron (ZOFRAN) injection 4 mg, 4 mg, Intravenous, Q6H PRN, Floydene Flock, MD   Oral care mouth rinse, 15 mL, Mouth Rinse, Q2H, Floydene Flock, MD, 15 mL at 10/06/23 1439   Oral care mouth rinse, 15 mL, Mouth Rinse, PRN, Floydene Flock, MD No current outpatient medications on file.  Vitals   Vitals:   10/06/23 1015 10/06/23 1030 10/06/23 1045 10/06/23 1405  BP: 108/65 101/63 104/68 117/79  Pulse: 76 78 74 73  Resp: 16 16 16 13   Temp:      TempSrc:      SpO2: 100% 98% 99% 99%  Weight:        Body mass index is 42.57 kg/m.  Physical Exam   Constitutional: Appears well-developed and well-nourished.  Psych: Affect appropriate to situation.  Eyes: No  scleral injection.  HENT: No OP obstruction.  Head: Normocephalic.  Cardiovascular: Normal rate and regular rhythm.  Respiratory: Effort normal, non-labored breathing.  GI: Soft.  No distension. There is no tenderness.  Skin: WDI.   Neurologic Examination   Neuro: *MS: A&O x2, reports year is 2025. Follows multi-step commands.  *Speech: mild dysarthria or aphasia, able to name and repeat. *CN:    I: Deferred   II,III: PERRLA, VFF by confrontation, optic discs not visualized 2/2 pupillary constriction   III,IV,VI: EOMI w/o nystagmus, no  ptosis   V: Sensation intact from V1 to V3 to LT   VII: Eyelid closure was full.  Smile symmetric.   VIII: Hearing intact to voice   IX,X: Voice normal, palate elevates symmetrically    XI: SCM/trap 5/5 bilat   XII: Tongue protrudes midline, no atrophy or fasciculations  *Motor:   Normal bulk.  No tremor, rigidity or bradykinesia. No pronator drift.   Strength: Dlt Bic Tri WE WrF FgS Gr HF KnF KnE PlF DoF    Left 5 5 5 5 5 5 5 5 5 5 5 5     Right 5 5 5 5 5 5 5 5 5 5 5 5    *Sensory: Intact to light touch, pinprick, temperature vibration throughout. Symmetric. Propioception intact bilat.  No double-simultaneous extinction.  *Coordination:  Finger-to-nose, heel-to-shin, rapid alternating motions were intact. *Reflexes:  2+ and symmetric throughout without clonus; toes down-going bilat *Gait: deferred   Labs/Imaging/Neurodiagnostic studies   CBC:  Recent Labs  Lab 21-Oct-2023 0445  WBC 14.5*  NEUTROABS 9.7*  HGB 12.0  HCT 38.1  MCV 80.5  PLT 324   Basic Metabolic Panel:  Lab Results  Component Value Date   NA 138 10/21/2023   K 3.5 10/21/2023   CO2 19 (L) 21-Oct-2023   GLUCOSE 166 (H) 21-Oct-2023   BUN 11 10/21/23   CREATININE 0.97 October 21, 2023   CALCIUM 8.6 (L) 10/21/23   GFRNONAA >60 10-21-23   GFRAA >60 04/29/2014   Lipid Panel: No results found for: "LDLCALC" HgbA1c:  Lab Results  Component Value Date   HGBA1C 5.6 October 21, 2023   Urine Drug Screen:     Component Value Date/Time   LABOPIA NONE DETECTED Oct 21, 2023 0526   COCAINSCRNUR NONE DETECTED 21-Oct-2023 0526   LABBENZ NONE DETECTED 10-21-23 0526   AMPHETMU NONE DETECTED 10/21/2023 0526   THCU NONE DETECTED 10/21/23 0526   LABBARB NONE DETECTED 10/21/2023 0526    Alcohol Level     Component Value Date/Time   ETH <10 2023/10/21 0445   INR No results found for: "INR" APTT No results found for: "APTT" AED levels: No results found for: "PHENYTOIN", "ZONISAMIDE", "LAMOTRIGINE", "LEVETIRACETA"  CT Head  without contrast(Personally reviewed): No acute process  ASSESSMENT   This is a 42 year old woman with a past medical history significant for chronic pain, cervical dystonia, who presented with seizure at home and had 2nd witnessed GTC in ED. she was initially postictal but has now returned almost to her baseline although she remained confused about the year.  She has a history consistent with focal seizures with impaired awareness with secondary generalization although has not previously been worked up for this and has never been on seizure medication before.  Given her history of prior events she is at increased risk for recurrent seizure and I would recommend seizure medication going forward.  She has been recently extremely sleep deprived in the context of her job which likely further decrease her seizure threshold.  RECOMMENDATIONS   -  Keppra 20mg /kg f/b 500mg  bid - MRI brain wwo - rEEG tomorrow - AED should be continued at discharge even if MRI and EEG are normal - Seizure precautions - Can be discharged tomorrow after EEG results if no further clinical events c/f seizure - Patient counseled not to drive x6 mos after last seizure  Neurology will f/u on MRI and EEG and touch base with primary team tomorrow. ______________________________________________________________________    Signed, Jefferson Fuel, MD [facility]

## 2023-10-06 NOTE — ED Notes (Signed)
Pt opening eyes to name and following commands appropriately. Lift lower body and rolls over in bed with repositioning. Squeezes hands on command and has equal moderate grips. Bilat PERRLA.

## 2023-10-07 DIAGNOSIS — G9341 Metabolic encephalopathy: Secondary | ICD-10-CM

## 2023-10-07 DIAGNOSIS — R569 Unspecified convulsions: Secondary | ICD-10-CM | POA: Diagnosis not present

## 2023-10-07 DIAGNOSIS — D72829 Elevated white blood cell count, unspecified: Secondary | ICD-10-CM

## 2023-10-07 DIAGNOSIS — G894 Chronic pain syndrome: Secondary | ICD-10-CM | POA: Diagnosis not present

## 2023-10-07 DIAGNOSIS — F32A Depression, unspecified: Secondary | ICD-10-CM | POA: Insufficient documentation

## 2023-10-07 DIAGNOSIS — F3289 Other specified depressive episodes: Secondary | ICD-10-CM

## 2023-10-07 DIAGNOSIS — R739 Hyperglycemia, unspecified: Secondary | ICD-10-CM

## 2023-10-07 LAB — CBG MONITORING, ED
Glucose-Capillary: 105 mg/dL — ABNORMAL HIGH (ref 70–99)
Glucose-Capillary: 120 mg/dL — ABNORMAL HIGH (ref 70–99)
Glucose-Capillary: 97 mg/dL (ref 70–99)

## 2023-10-07 MED ORDER — LEVETIRACETAM 500 MG PO TABS: 500.0000 mg | ORAL_TABLET | Freq: Two times a day (BID) | ORAL | 0 refills | Status: AC

## 2023-10-07 MED ORDER — LORAZEPAM 2 MG/ML IJ SOLN
1.0000 mg | Freq: Once | INTRAMUSCULAR | Status: AC
  Administered 2023-10-07: 1 mg via INTRAVENOUS
  Filled 2023-10-07: qty 1

## 2023-10-07 MED ORDER — LISDEXAMFETAMINE DIMESYLATE 50 MG PO CAPS: 50.0000 mg | ORAL_CAPSULE | Freq: Every morning | ORAL | Status: DC

## 2023-10-07 MED ORDER — LEVETIRACETAM 500 MG PO TABS
500.0000 mg | ORAL_TABLET | Freq: Two times a day (BID) | ORAL | Status: DC
  Administered 2023-10-07: 500 mg via ORAL
  Filled 2023-10-07: qty 1

## 2023-10-07 MED ORDER — LISDEXAMFETAMINE DIMESYLATE 10 MG PO CAPS
50.0000 mg | ORAL_CAPSULE | Freq: Every morning | ORAL | Status: DC
Start: 2023-10-07 — End: 2023-10-07

## 2023-10-07 MED ORDER — GABAPENTIN 300 MG PO CAPS: 600.0000 mg | ORAL_CAPSULE | Freq: Four times a day (QID) | ORAL | Status: DC

## 2023-10-07 MED ORDER — BUPRENORPHINE HCL-NALOXONE HCL 8-2 MG SL SUBL
1.0000 | SUBLINGUAL_TABLET | Freq: Once | SUBLINGUAL | Status: AC
  Administered 2023-10-07: 1 via SUBLINGUAL
  Filled 2023-10-07: qty 1

## 2023-10-07 MED ORDER — BUPRENORPHINE HCL-NALOXONE HCL 8-2 MG SL SUBL: 1.0000 | SUBLINGUAL_TABLET | Freq: Three times a day (TID) | SUBLINGUAL | Status: DC

## 2023-10-07 MED ORDER — GABAPENTIN 300 MG PO CAPS
400.0000 mg | ORAL_CAPSULE | Freq: Once | ORAL | Status: AC
Start: 2023-10-07 — End: 2023-10-07
  Administered 2023-10-07: 400 mg via ORAL
  Filled 2023-10-07: qty 1

## 2023-10-07 MED ORDER — NORTRIPTYLINE HCL 10 MG PO CAPS
10.0000 mg | ORAL_CAPSULE | Freq: Every day | ORAL | Status: DC
Start: 2023-10-07 — End: 2023-10-07

## 2023-10-07 NOTE — Assessment & Plan Note (Signed)
Mental status improving daily.

## 2023-10-07 NOTE — ED Notes (Signed)
Called and updated son per patient's request.

## 2023-10-07 NOTE — Discharge Instructions (Signed)
No Driving.  Need to follow up with neurology

## 2023-10-07 NOTE — ED Notes (Signed)
Patient ambulatory to bedside commode.  No complaints voiced at this time

## 2023-10-07 NOTE — ED Notes (Signed)
Door to patient's room left up for safety.  In view of nursing station

## 2023-10-07 NOTE — ED Notes (Signed)
Patient has removed all monitoring cables, IV, and clothing.  Patient has been redirected to remain in bed.  Will attempt to replace all equipment after medications

## 2023-10-07 NOTE — Assessment & Plan Note (Addendum)
On Suboxone which I reordered here in the hospital but patient signed out AGAINST MEDICAL ADVICE.  Patient on gabapentin

## 2023-10-07 NOTE — ED Notes (Signed)
Patient currently complaining of being uncomfortable.  Has come out of room x 2.  When RN attempting to address needs, pt unable to describe or state what she needs.  Continues to state "I am just uncomfortable."  CBG checked and WNL.  Secure message sent to NP for recommendations.

## 2023-10-07 NOTE — Assessment & Plan Note (Signed)
On Vyvanse and Pamelor

## 2023-10-07 NOTE — ED Notes (Signed)
Patient pacing in room. Pt wants to go home. MD aware.

## 2023-10-07 NOTE — Progress Notes (Signed)
Patient to sign out Against medical advise.  Does not want to stay for eeg.  No driving recommended.  Will send keppra to pharmacy.  Dr Renae Gloss

## 2023-10-07 NOTE — Discharge Summary (Signed)
Physician Discharge Summary   Patient: Latoya Molina MRN: [id number] DOB: [birth year]  Admit date:     10/06/2023  Date of signing out AGAINST MEDICAL ADVICE: 10/07/23  Discharge Physician: Alford Highland   PCP: System, Provider Not In   Recommendations at discharge:   No driving Follow-up with your medical doctor Will need to follow-up with outpatient neurology Recommend outpatient EEG.  Discharge Diagnoses: Principal Problem:   Seizure (HCC) Active Problems:   Acute metabolic encephalopathy   Leukocytosis   Hyperglycemia   Chronic pain syndrome   Depression    Hospital Course: 42 year old female with chronic pain, dystonia presented with seizure and altered mental status.  Neurology recommended seizure medications.  MRI of the brain was negative.  EEG was ordered.  12/9.  Notified that the patient was signing out AGAINST MEDICAL ADVICE.  I came to see the patient.  I recommended no driving with a seizure.  Recommended neurology follow-up to obtain EEG as outpatient.  I prescribed Keppra into her pharmacy 500 mg twice a day.  Assessment and Plan: * Seizure [facility]) Neurology recommended Keppra.  I did not see any orders for Keppra in the computer.  I ordered Keppra 500 mg twice daily into her pharmacy.  No driving.  Recommend outpatient EEG and neurology follow-up.   Acute metabolic encephalopathy Mental status improved  Depression On Vyvanse and Pamelor  Chronic pain syndrome On Suboxone which I reordered here in the hospital but patient signed out AGAINST MEDICAL ADVICE.  Patient on gabapentin  Hyperglycemia Patient not a diabetic hemoglobin A1c 5.6  Leukocytosis White count 14 on presentation, likely reactive with seizure         Consultants: Neurology Procedures performed: None Disposition: Home.  No driving Diet recommendation:  Regular diet DISCHARGE MEDICATION: Allergies as of 10/07/2023       Reactions   Morphine And Codeine     Morphine Rash        Medication List     STOP taking these medications    amphetamine-dextroamphetamine 15 MG tablet Commonly known as: ADDERALL   pregabalin 100 MG capsule Commonly known as: LYRICA       TAKE these medications    gabapentin 600 MG tablet Commonly known as: NEURONTIN Take 600 mg by mouth 4 (four) times daily.   levETIRAcetam 500 MG tablet Commonly known as: KEPPRA Take 1 tablet (500 mg total) by mouth 2 (two) times daily.   lisdexamfetamine 50 MG capsule Commonly known as: VYVANSE Take 50 mg by mouth every morning.   nortriptyline 10 MG capsule Commonly known as: PAMELOR Take 10 mg by mouth at bedtime.   Suboxone 8-2 MG Film Generic drug: Buprenorphine HCl-Naloxone HCl Place 1 Film under the tongue 3 (three) times daily.        Follow-up Information     your medical docotr 5 days Follow up.          [facility] Follow up.   Why: seizure Contact information: [address]     [address] [place] [address] [phone]               Discharge Exam: Ceasar Mons Weights   10/06/23 0455  Weight: 112.5 kg   Patient declined exam Physical Exam HENT:     Head: Normocephalic.  Eyes:     General: Lids are normal.     Conjunctiva/sclera: Conjunctivae normal.  Neurological:     Mental Status: She is alert.     Comments: Answer questions  appropriately.  Moves all extremities.     Condition at discharge: fair  The results of significant diagnostics from this hospitalization (including imaging, microbiology, ancillary and laboratory) are listed below for reference.   Imaging Studies: MR BRAIN W WO CONTRAST  Result Date: 10/06/2023 CLINICAL DATA:  Seizure, new-onset, no history of trauma EXAM: MRI HEAD WITHOUT AND WITH CONTRAST TECHNIQUE: Multiplanar, multiecho pulse sequences of the brain and surrounding structures were obtained without and with intravenous contrast. CONTRAST:  10mL GADAVIST  GADOBUTROL 1 MMOL/ML IV SOLN COMPARISON:  Head CT 10/06/23 FINDINGS: Brain: Negative for an acute infarct. No hemorrhage. No hydrocephalus. No extra-axial fluid collection. No mass effect. No mass lesion. No contrast-enhancing lesions visualized. No seizure focus identified. Vascular: Normal flow voids. Skull and upper cervical spine: Normal marrow signal. Sinuses/Orbits: No middle ear or mastoid effusion. Paranasal sinuses are clear. Orbits are unremarkable. Other: None. IMPRESSION: No acute intracranial process. No seizure focus identified. Electronically Signed   By: Lorenza Cambridge M.D.   On: 10/06/2023 12:48   DG Chest Port 1 View  Result Date: 10/06/2023 CLINICAL DATA:  [id number] Encounter for imaging to screen for metal prior to magnetic resonance imaging (MRI) [id number] EXAM: PORTABLE CHEST 1 VIEW COMPARISON:  06/27/2013 FINDINGS: Single frontal view of the chest demonstrates an unremarkable cardiac silhouette. No airspace disease, effusion, or pneumothorax. No acute bony abnormalities. Cholecystectomy clips overlie right upper quadrant abdomen. No other retained radiopaque foreign bodies. IMPRESSION: 1. Metallic cholecystectomy clips right upper quadrant abdomen. No evidence of retained radiopaque foreign bodies elsewhere within the visualized chest. 2. No acute intrathoracic process. Electronically Signed   By: Sharlet Salina M.D.   On: 10/06/2023 11:33   DG Abd 1 View  Result Date: 10/06/2023 CLINICAL DATA:  Seizure, [id number] Seizure [facility]) [id number] [phone] Encounter for imaging to screen for metal prior to magnetic resonance imaging (MRI) [id number] EXAM: ABDOMEN - 1 VIEW COMPARISON:  None Available. FINDINGS: Two supine frontal views of the abdomen and pelvis are obtained. Metallic cholecystectomy clips overlie the right upper quadrant. No evidence of unexpected retained radiopaque foreign body. No bowel obstruction or ileus. No acute bony abnormalities. Lung bases are clear. IMPRESSION: 1. Cholecystectomy clips  right upper quadrant. No other retained radiopaque foreign bodies. Electronically Signed   By: Sharlet Salina M.D.   On: 10/06/2023 11:32   CT Cervical Spine Wo Contrast  Result Date: 10/06/2023 CLINICAL DATA:  42 year old female with altered mental status. Seizure like activity. EXAM: CT CERVICAL SPINE WITHOUT CONTRAST TECHNIQUE: Multidetector CT imaging of the cervical spine was performed without intravenous contrast. Multiplanar CT image reconstructions were also generated. RADIATION DOSE REDUCTION: This exam was performed according to the departmental dose-optimization program which includes automated exposure control, adjustment of the mA and/or kV according to patient size and/or use of iterative reconstruction technique. COMPARISON:  Head CT today.  Previous cervical spine CT 08/15/2005. FINDINGS: Alignment: Stable chronic straightening and mild reversal of cervical lordosis. Cervicothoracic junction alignment is within normal limits. Bilateral posterior element alignment is within normal limits. Skull base and vertebrae: Bone mineralization is within normal limits. Visualized skull base is intact. No atlanto-occipital dissociation. C1 and C2 appear intact and aligned. No acute osseous abnormality identified. Soft tissues and spinal canal: No prevertebral fluid or swelling. No visible canal hematoma. Mild retained secretions only in the nasopharynx. Hypopharynx is distended with gas. Negative visible other noncontrast neck soft tissues. Disc levels: Mild chronic disc and endplate degeneration at C6-C7 has progressed since 2006. Upper chest: Negative; mild respiratory  motion. Other: Absent dentition. IMPRESSION: 1. No acute traumatic injury identified in the cervical spine. 2. Mild chronic disc and endplate degeneration at C6-C7. Electronically Signed   By: Odessa Fleming M.D.   On: 10/06/2023 05:57   CT HEAD WO CONTRAST ( )  Result Date: 10/06/2023 CLINICAL DATA:  42 year old female with altered mental  status. Seizure like activity. EXAM: CT HEAD WITHOUT CONTRAST TECHNIQUE: Contiguous axial images were obtained from the base of the skull through the vertex without intravenous contrast. RADIATION DOSE REDUCTION: This exam was performed according to the departmental dose-optimization program which includes automated exposure control, adjustment of the mA and/or kV according to patient size and/or use of iterative reconstruction technique. COMPARISON:  Head CT 04/20/2023. FINDINGS: Brain: Cerebral volume appears stable and normal. No midline shift, ventriculomegaly, mass effect, evidence of mass lesion, intracranial hemorrhage or evidence of cortically based acute infarction. Gray-white matter differentiation is within normal limits throughout the brain. No edema or encephalomalacia identified. Vascular: No suspicious intracranial vascular hyperdensity. Skull: Hyperostosis of the calvarium again noted. No acute osseous abnormality identified. Sinuses/Orbits: Visualized paranasal sinuses and mastoids are stable and well aerated. Other: Small volume retained secretions in the visible pharynx. No orbit or scalp soft tissue injury identified. IMPRESSION: 1. Stable and normal noncontrast CT appearance of the brain. 2. Mild retained secretions in the trachea. No acute traumatic injury identified. Electronically Signed   By: Odessa Fleming M.D.   On: 10/06/2023 05:54    Microbiology: No results found for this or any previous visit.  Labs: CBC: Recent Labs  Lab 10/06/23 0445  WBC 14.5*  NEUTROABS 9.7*  HGB 12.0  HCT 38.1  MCV 80.5  PLT 324   Basic Metabolic Panel: Recent Labs  Lab 10/06/23 0445  NA 138  K 3.5  CL 103  CO2 19*  GLUCOSE 166*  BUN 11  CREATININE 0.97  CALCIUM 8.6*  MG 2.3   Liver Function Tests: Recent Labs  Lab 10/06/23 0445  AST 23  ALT 17  ALKPHOS 99  BILITOT 0.3  PROT 7.2  ALBUMIN 3.6   CBG: Recent Labs  Lab 10/06/23 1600 10/06/23 2103 10/07/23 0043 10/07/23 0647  10/07/23 0810  GLUCAP 81 94 105* 97 120*    Discharge time spent: less than 30 minutes.  Signed: Alford Highland, MD [facility] 10/07/2023

## 2023-10-07 NOTE — Hospital Course (Signed)
42 year old female with chronic pain, dystonia presented with seizure and altered mental status.  Neurology recommended seizure medications.  MRI of the brain was negative.  EEG was ordered.  12/9.  Notified that the patient was signing out AGAINST MEDICAL ADVICE.  I came to see the patient.  I recommended no driving with a seizure.  Recommended neurology follow-up to obtain EEG as outpatient.  I prescribed Keppra into her pharmacy 500 mg twice a day.

## 2023-10-07 NOTE — ED Notes (Signed)
 Renae Gloss ,MD at bedside

## 2023-10-08 LAB — HIV ANTIBODY (ROUTINE TESTING W REFLEX): HIV Screen 4th Generation wRfx: NONREACTIVE
